# Patient Record
Sex: Female | Born: 1985 | Race: White | Hispanic: No | Marital: Married | Smoking: Current every day smoker
Health system: Southern US, Community
[De-identification: ages and names within clinical notes are randomized; demographics above are authoritative.]

## PROBLEM LIST (undated history)

## (undated) ENCOUNTER — Inpatient Hospital Stay (HOSPITAL_COMMUNITY): Payer: Self-pay

## (undated) DIAGNOSIS — F1990 Other psychoactive substance use, unspecified, uncomplicated: Secondary | ICD-10-CM

## (undated) DIAGNOSIS — F329 Major depressive disorder, single episode, unspecified: Secondary | ICD-10-CM

## (undated) DIAGNOSIS — Z5900 Homelessness unspecified: Secondary | ICD-10-CM

## (undated) DIAGNOSIS — F32A Depression, unspecified: Secondary | ICD-10-CM

## (undated) DIAGNOSIS — F419 Anxiety disorder, unspecified: Secondary | ICD-10-CM

## (undated) DIAGNOSIS — R51 Headache: Secondary | ICD-10-CM

## (undated) DIAGNOSIS — F1110 Opioid abuse, uncomplicated: Secondary | ICD-10-CM

## (undated) DIAGNOSIS — Z789 Other specified health status: Secondary | ICD-10-CM

## (undated) HISTORY — PX: NO PAST SURGERIES: SHX2092

## (undated) HISTORY — DX: Depression, unspecified: F32.A

## (undated) HISTORY — DX: Major depressive disorder, single episode, unspecified: F32.9

---
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
## 2007-07-08 ENCOUNTER — Inpatient Hospital Stay (HOSPITAL_COMMUNITY): Admission: AD | Admit: 2007-07-08 | Discharge: 2007-07-08 | Payer: Self-pay | Admitting: Obstetrics & Gynecology

## 2007-07-08 ENCOUNTER — Ambulatory Visit: Payer: Self-pay | Admitting: Physician Assistant

## 2007-10-28 ENCOUNTER — Ambulatory Visit (HOSPITAL_COMMUNITY): Admission: RE | Admit: 2007-10-28 | Discharge: 2007-10-28 | Payer: Self-pay | Admitting: Gynecology

## 2007-10-28 ENCOUNTER — Ambulatory Visit: Payer: Self-pay | Admitting: Obstetrics & Gynecology

## 2007-11-02 ENCOUNTER — Ambulatory Visit: Payer: Self-pay | Admitting: Obstetrics & Gynecology

## 2007-11-09 ENCOUNTER — Ambulatory Visit (HOSPITAL_COMMUNITY): Admission: RE | Admit: 2007-11-09 | Discharge: 2007-11-09 | Payer: Self-pay | Admitting: Obstetrics & Gynecology

## 2007-11-09 ENCOUNTER — Ambulatory Visit: Payer: Self-pay | Admitting: Obstetrics & Gynecology

## 2007-11-16 ENCOUNTER — Ambulatory Visit: Payer: Self-pay | Admitting: Obstetrics & Gynecology

## 2007-11-19 ENCOUNTER — Observation Stay: Payer: Self-pay

## 2007-11-26 ENCOUNTER — Ambulatory Visit: Payer: Self-pay | Admitting: Family Medicine

## 2007-11-27 ENCOUNTER — Inpatient Hospital Stay (HOSPITAL_COMMUNITY): Admission: AD | Admit: 2007-11-27 | Discharge: 2007-11-30 | Payer: Self-pay | Admitting: Obstetrics & Gynecology

## 2007-11-28 ENCOUNTER — Ambulatory Visit: Payer: Self-pay | Admitting: Obstetrics and Gynecology

## 2009-05-27 ENCOUNTER — Emergency Department (HOSPITAL_COMMUNITY): Admission: EM | Admit: 2009-05-27 | Discharge: 2009-05-27 | Payer: Self-pay | Admitting: Emergency Medicine

## 2009-06-25 ENCOUNTER — Emergency Department (HOSPITAL_COMMUNITY): Admission: EM | Admit: 2009-06-25 | Discharge: 2009-06-26 | Payer: Self-pay | Admitting: Emergency Medicine

## 2009-07-11 ENCOUNTER — Emergency Department (HOSPITAL_COMMUNITY): Admission: EM | Admit: 2009-07-11 | Discharge: 2009-07-11 | Payer: Self-pay | Admitting: Emergency Medicine

## 2009-09-27 ENCOUNTER — Emergency Department (HOSPITAL_COMMUNITY): Admission: EM | Admit: 2009-09-27 | Discharge: 2009-09-27 | Payer: Self-pay | Admitting: Emergency Medicine

## 2009-11-13 ENCOUNTER — Emergency Department: Payer: Self-pay | Admitting: Emergency Medicine

## 2010-12-29 LAB — OB RESULTS CONSOLE GC/CHLAMYDIA: Gonorrhea: NEGATIVE

## 2010-12-29 LAB — OB RESULTS CONSOLE RPR: RPR: NONREACTIVE

## 2010-12-29 LAB — OB RESULTS CONSOLE ABO/RH: RH Type: POSITIVE

## 2011-02-06 LAB — URINALYSIS, ROUTINE W REFLEX MICROSCOPIC
Bilirubin Urine: NEGATIVE
Glucose, UA: NEGATIVE mg/dL
Hgb urine dipstick: NEGATIVE
Ketones, ur: NEGATIVE mg/dL
Nitrite: NEGATIVE
Protein, ur: NEGATIVE mg/dL
Specific Gravity, Urine: 1.025 (ref 1.005–1.030)
Urobilinogen, UA: 0.2 mg/dL (ref 0.0–1.0)
pH: 6 (ref 5.0–8.0)

## 2011-02-08 LAB — URINE CULTURE

## 2011-02-08 LAB — URINE MICROSCOPIC-ADD ON

## 2011-02-08 LAB — CBC
HCT: 42.9 % (ref 36.0–46.0)
MCHC: 34.9 g/dL (ref 30.0–36.0)
MCV: 87.9 fL (ref 78.0–100.0)
Platelets: 212 10*3/uL (ref 150–400)
WBC: 9.9 10*3/uL (ref 4.0–10.5)

## 2011-02-08 LAB — URINALYSIS, ROUTINE W REFLEX MICROSCOPIC
Glucose, UA: NEGATIVE mg/dL
Ketones, ur: 15 mg/dL — AB
pH: 5.5 (ref 5.0–8.0)

## 2011-02-08 LAB — HEPATIC FUNCTION PANEL
AST: 15 U/L (ref 0–37)
Albumin: 4.2 g/dL (ref 3.5–5.2)
Total Protein: 7.4 g/dL (ref 6.0–8.3)

## 2011-02-08 LAB — BASIC METABOLIC PANEL
BUN: 15 mg/dL (ref 6–23)
CO2: 30 mEq/L (ref 19–32)
Chloride: 104 mEq/L (ref 96–112)
Potassium: 3.8 mEq/L (ref 3.5–5.1)

## 2011-02-08 LAB — DIFFERENTIAL
Eosinophils Absolute: 0 10*3/uL (ref 0.0–0.7)
Eosinophils Relative: 0 % (ref 0–5)
Lymphs Abs: 1 10*3/uL (ref 0.7–4.0)
Monocytes Relative: 5 % (ref 3–12)

## 2011-02-09 LAB — DIFFERENTIAL
Eosinophils Absolute: 0 10*3/uL (ref 0.0–0.7)
Lymphocytes Relative: 10 % — ABNORMAL LOW (ref 12–46)
Lymphs Abs: 0.7 10*3/uL (ref 0.7–4.0)
Monocytes Relative: 6 % (ref 3–12)
Neutrophils Relative %: 84 % — ABNORMAL HIGH (ref 43–77)

## 2011-02-09 LAB — URINALYSIS, ROUTINE W REFLEX MICROSCOPIC
Bilirubin Urine: NEGATIVE
Hgb urine dipstick: NEGATIVE
Specific Gravity, Urine: 1.02 (ref 1.005–1.030)
Urobilinogen, UA: 1 mg/dL (ref 0.0–1.0)

## 2011-02-09 LAB — URINE MICROSCOPIC-ADD ON

## 2011-02-09 LAB — CBC
MCV: 88 fL (ref 78.0–100.0)
RBC: 4.45 MIL/uL (ref 3.87–5.11)
WBC: 7.7 10*3/uL (ref 4.0–10.5)

## 2011-02-10 LAB — URINALYSIS, ROUTINE W REFLEX MICROSCOPIC
Glucose, UA: NEGATIVE mg/dL
Leukocytes, UA: NEGATIVE
Specific Gravity, Urine: >1.03 — ABNORMAL HIGH (ref 1.005–1.030)
pH: 6 (ref 5.0–8.0)

## 2011-02-10 LAB — URINE MICROSCOPIC-ADD ON

## 2011-07-25 LAB — CBC
HCT: 35.4 — ABNORMAL LOW
MCHC: 34.2
MCHC: 34.2
MCV: 81.8
Platelets: 176
Platelets: 208
RBC: 3.47 — ABNORMAL LOW
RDW: 15.5
WBC: 12.4 — ABNORMAL HIGH

## 2011-07-25 LAB — POCT URINALYSIS DIP (DEVICE)
Hgb urine dipstick: NEGATIVE
Nitrite: NEGATIVE
Nitrite: NEGATIVE
Protein, ur: NEGATIVE
Protein, ur: NEGATIVE
pH: 7
pH: 7

## 2011-07-25 LAB — CCBB MATERNAL DONOR DRAW

## 2011-07-28 ENCOUNTER — Emergency Department: Payer: Self-pay | Admitting: Internal Medicine

## 2011-08-09 LAB — POCT URINALYSIS DIP (DEVICE)
Ketones, ur: NEGATIVE
Ketones, ur: NEGATIVE
Nitrite: NEGATIVE
Nitrite: NEGATIVE
Operator id: 135281
Operator id: 159681
Protein, ur: NEGATIVE
Protein, ur: NEGATIVE
Urobilinogen, UA: 1
pH: 7
pH: 7

## 2011-08-16 LAB — URINALYSIS, ROUTINE W REFLEX MICROSCOPIC
Bilirubin Urine: NEGATIVE
Ketones, ur: 15 — AB
Nitrite: NEGATIVE
Protein, ur: NEGATIVE
Urobilinogen, UA: 0.2

## 2011-09-13 ENCOUNTER — Emergency Department: Payer: Self-pay | Admitting: Emergency Medicine

## 2011-09-16 ENCOUNTER — Other Ambulatory Visit: Payer: Self-pay | Admitting: Emergency Medicine

## 2011-10-02 ENCOUNTER — Emergency Department: Payer: Self-pay | Admitting: Emergency Medicine

## 2011-11-05 NOTE — L&D Delivery Note (Signed)
Delivery Note At 11:05 PM a viable and healthy female was delivered via Vaginal, Spontaneous Delivery (Presentation: Right Occiput Anterior).  APGAR: 7, 8; weight 7 lb 12.2 oz (3520 g).   Placenta status: intact, spontaneous.  Cord: 3 vessels with the following complications: None.    Anesthesia: Epidural  Episiotomy: None Lacerations: 1st degree;Perineal, hemostatic Suture Repair: N/A Est. Blood Loss (mL): 300 ml  Mom to postpartum.  Baby stable, placed skin to skin with mother.  LEFTWICH-KIRBY, Iridiana Fonner 05/19/2012, 11:31 PM

## 2012-02-12 ENCOUNTER — Emergency Department (HOSPITAL_COMMUNITY)
Admission: EM | Admit: 2012-02-12 | Discharge: 2012-02-12 | Disposition: A | Discharge status: Home / Self Care | Payer: Medicaid Other | Attending: Emergency Medicine | Admitting: Emergency Medicine

## 2012-02-12 ENCOUNTER — Encounter (HOSPITAL_COMMUNITY): Payer: Self-pay | Admitting: Emergency Medicine

## 2012-02-12 DIAGNOSIS — K047 Periapical abscess without sinus: Secondary | ICD-10-CM

## 2012-02-12 MED ORDER — HYDROCODONE-ACETAMINOPHEN 5-325 MG PO TABS: 1.0000 | ORAL_TABLET | Freq: Four times a day (QID) | ORAL | Status: AC | PRN

## 2012-02-12 MED ORDER — AMOXICILLIN 500 MG PO CAPS: 500.0000 mg | ORAL_CAPSULE | Freq: Three times a day (TID) | ORAL | Status: AC

## 2012-02-12 NOTE — Discharge Instructions (Signed)
Please increase water and juices. Tylenol every 4 hours as needed for pain or fever. PLEASE see a dentist as soon as possible for possible evacuation of your dental abscess. Amoxicillin three times daily with food. Norco ONLY if needed every 6 hours for pain. Please notify your Gyn MD to update him/her of medications you are taking.Abscessed Tooth An abscessed tooth is an infection around your tooth. It may be caused by holes or damage to the tooth (cavity) or a dental disease. An abscessed tooth causes mild to very bad pain in and around the tooth. See your dentist right away if you have tooth or gum pain. HOME CARE  Take your medicine as told. Finish it even if you start to feel better.   Do not drive after taking pain medicine.   Rinse your mouth (gargle) often with salt water ( teaspoon salt in 8 ounces of warm water).   Do not apply heat to the outside of your face.  GET HELP RIGHT AWAY IF:   You have a temperature by mouth above 102 F (38.9 C), not controlled by medicine.   You have chills and a very bad headache.   You have problems breathing or swallowing.   Your mouth will not open.   You develop puffiness (swelling) on the neck or around the eye.   Your pain is not helped by medicine.   Your pain is getting worse instead of better.  MAKE SURE YOU:   Understand these instructions.   Will watch your condition.   Will get help right away if you are not doing well or get worse.  Document Released: 04/08/2008 Document Revised: 10/10/2011 Document Reviewed: 01/29/2011 [facility] Patient Information 2012 [place], [place].

## 2012-02-12 NOTE — ED Notes (Signed)
Pt c/o rt lower jaw pain x one month, but states the area started to swell about Friday.

## 2012-02-12 NOTE — ED Provider Notes (Signed)
History     CSN: [id number]  Arrival date & time 02/12/12  4098   First MD Initiated Contact with Patient 02/12/12 [phone]      Chief Complaint  Patient presents with  . Dental Pain    (Consider location/radiation/quality/duration/timing/severity/associated sxs/prior treatment) Patient is a 26 y.o. female presenting with tooth pain. The history is provided by the patient.  Dental PainThe primary symptoms include mouth pain. Primary symptoms do not include shortness of breath or cough. The symptoms began more than 1 month ago. The symptoms are worsening. The symptoms are new. The symptoms occur constantly.  Additional symptoms include: dental sensitivity to temperature, gum swelling, gum tenderness, jaw pain and facial swelling. Additional symptoms do not include: nosebleeds. Associated medical issues comments: Pregnancy.    History reviewed. No pertinent past medical history.  History reviewed. No pertinent past surgical history.  History reviewed. No pertinent family history.  History  Substance Use Topics  . Smoking status: Not on file  . Smokeless tobacco: Not on file  . Alcohol Use: No    OB History    Grav Para Term Preterm Abortions TAB SAB Ect Mult Living   1               Review of Systems  Constitutional: Negative for activity change.       All ROS Neg except as noted in HPI  HENT: Positive for facial swelling and dental problem. Negative for nosebleeds and neck pain.   Eyes: Negative for photophobia and discharge.  Respiratory: Negative for cough, shortness of breath and wheezing.   Cardiovascular: Negative for chest pain and palpitations.  Gastrointestinal: Negative for abdominal pain and blood in stool.  Genitourinary: Negative for dysuria, frequency and hematuria.       Pregnancy  Musculoskeletal: Negative for back pain and arthralgias.  Skin: Negative.   Neurological: Negative for dizziness, seizures and speech difficulty.  Psychiatric/Behavioral:  Negative for hallucinations and confusion.    Allergies  Review of patient's allergies indicates no known allergies.  Home Medications   Current Outpatient Rx  Name Route Sig Dispense Refill  . AMOXICILLIN 500 MG PO CAPS Oral Take 1 capsule (500 mg total) by mouth 3 (three) times daily. Take with food 21 capsule 0  . HYDROCODONE-ACETAMINOPHEN 5-325 MG PO TABS Oral Take 1 tablet by mouth every 6 (six) hours as needed for pain. Take with food 15 tablet 0    BP 116/81  Pulse 102  Temp(Src) 97.9 F (36.6 C) (Oral)  Resp 20  Ht 5\' 4"  (1.626 m)  Wt 152 lb (68.947 kg)  BMI 26.09 kg/m2  SpO2 99%  LMP 08/08/2011  Physical Exam  Nursing note and vitals reviewed. Constitutional: She is oriented to person, place, and time. She appears well-developed and well-nourished.  Non-toxic appearance.  HENT:  Head: Normocephalic.  Right Ear: Tympanic membrane and external ear normal.  Left Ear: Tympanic membrane and external ear normal.       Abscess of the right lower jaw area. Airway patent. Speech clear.  Eyes: EOM and lids are normal. Pupils are equal, round, and reactive to light.  Neck: Normal range of motion. Neck supple. Carotid bruit is not present.  Cardiovascular: Normal rate, regular rhythm, normal heart sounds, intact distal pulses and normal pulses.  Exam reveals no gallop.   No murmur heard. Pulmonary/Chest: Breath sounds normal. No respiratory distress.  Abdominal: Soft. Bowel sounds are normal. There is no tenderness. There is no guarding.  Abd gravid  Musculoskeletal: Normal range of motion.  Lymphadenopathy:       Head (right side): No submandibular adenopathy present.       Head (left side): No submandibular adenopathy present.    She has no cervical adenopathy.  Neurological: She is alert and oriented to person, place, and time. She has normal strength. No cranial nerve deficit or sensory deficit.  Skin: Skin is warm and dry.  Psychiatric: She has a normal mood and  affect. Her speech is normal.    ED Course  Procedures (including critical care time)  Labs Reviewed - No data to display No results found.   1. Dental abscess       MDM  I have reviewed nursing notes, vital signs, and all appropriate lab and imaging results for this patient. Pt reports being [redacted] weeks pregnant. She has an abscess of the right lower jaw.  Rx for amoxicillin and norco -1 q6h, for severe pain. Tylenol every 4 hours for pain.. Pt to discuss medications with Gyn MD.       Kathie Dike, PA 02/12/12 [phone]

## 2012-02-13 NOTE — ED Provider Notes (Signed)
Medical screening examination/treatment/procedure(s) were performed by non-physician practitioner and as supervising physician I was immediately available for consultation/collaboration.  Shelda Jakes, MD 02/13/12 [phone]

## 2012-03-02 ENCOUNTER — Encounter (HOSPITAL_COMMUNITY): Payer: Self-pay

## 2012-03-02 ENCOUNTER — Inpatient Hospital Stay (HOSPITAL_COMMUNITY)
Admission: AD | Admit: 2012-03-02 | Discharge: 2012-03-02 | Disposition: A | Discharge status: Home / Self Care | Payer: Medicaid Other | Source: Ambulatory Visit | Attending: Obstetrics & Gynecology | Admitting: Obstetrics & Gynecology

## 2012-03-02 DIAGNOSIS — R0789 Other chest pain: Secondary | ICD-10-CM

## 2012-03-02 DIAGNOSIS — R071 Chest pain on breathing: Secondary | ICD-10-CM | POA: Insufficient documentation

## 2012-03-02 DIAGNOSIS — O99891 Other specified diseases and conditions complicating pregnancy: Secondary | ICD-10-CM | POA: Insufficient documentation

## 2012-03-02 DIAGNOSIS — R0781 Pleurodynia: Secondary | ICD-10-CM

## 2012-03-02 DIAGNOSIS — Z3490 Encounter for supervision of normal pregnancy, unspecified, unspecified trimester: Secondary | ICD-10-CM

## 2012-03-02 DIAGNOSIS — R079 Chest pain, unspecified: Secondary | ICD-10-CM

## 2012-03-02 HISTORY — DX: Other specified health status: Z78.9

## 2012-03-02 LAB — COMPREHENSIVE METABOLIC PANEL
ALT: 5 U/L (ref 0–35)
AST: 11 U/L (ref 0–37)
Albumin: 2.8 g/dL — ABNORMAL LOW (ref 3.5–5.2)
BUN: 4 mg/dL — ABNORMAL LOW (ref 6–23)
CO2: 23 mEq/L (ref 19–32)
Chloride: 103 mEq/L (ref 96–112)
Creatinine, Ser: 0.46 mg/dL — ABNORMAL LOW (ref 0.50–1.10)
GFR calc Af Amer: >90 mL/min (ref >90)
Total Protein: 6.4 g/dL (ref 6.0–8.3)

## 2012-03-02 LAB — CBC
HCT: 38.2 % (ref 36.0–46.0)
Hemoglobin: 12.7 g/dL (ref 12.0–15.0)
MCV: 91.4 fL (ref 78.0–100.0)
RBC: 4.18 MIL/uL (ref 3.87–5.11)
WBC: 14.1 10*3/uL — ABNORMAL HIGH (ref 4.0–10.5)

## 2012-03-02 LAB — URINALYSIS, ROUTINE W REFLEX MICROSCOPIC
Glucose, UA: NEGATIVE mg/dL
Hgb urine dipstick: NEGATIVE
Specific Gravity, Urine: 1.015 (ref 1.005–1.030)
pH: 7 (ref 5.0–8.0)

## 2012-03-02 LAB — URINE MICROSCOPIC-ADD ON

## 2012-03-02 MED ORDER — CYCLOBENZAPRINE HCL 10 MG PO TABS: 10.0000 mg | ORAL_TABLET | Freq: Three times a day (TID) | ORAL | Status: AC | PRN

## 2012-03-02 MED ORDER — GI COCKTAIL ~~LOC~~
30.0000 mL | Freq: Once | ORAL | Status: AC
  Administered 2012-03-02: 30 mL via ORAL
  Filled 2012-03-02: qty 30

## 2012-03-02 MED ORDER — OXYCODONE-ACETAMINOPHEN 5-500 MG PO TABS: 1.0000 | ORAL_TABLET | ORAL | Status: AC | PRN

## 2012-03-02 NOTE — MAU Note (Signed)
Pt in c/o pain in ruq underneath breast in ribcage x 1.5 weeks.  Worsening pain since Friday.  States pain is a squeezing sensation and worse with movement.  Reports sharp stabbing pains when taking a deep breath.  Denies any bleeding or lof.  + FM.

## 2012-03-02 NOTE — MAU Note (Signed)
Patient states she started having "rib"pain on the right side on 4-20. Has become more consistent and more painful. Unable to sleep without sitting up at night. Better during the day. Hurts worse when taking a deep breath. Denies any bleeding, contractions or leaking. Reports good fetal movement. Has nausea but no vomiting.

## 2012-03-02 NOTE — MAU Provider Note (Signed)
History     CSN: [id number]  Arrival date and time: 03/02/12 1742   First Provider Initiated Contact with Patient 03/02/12 1837      Chief Complaint  Patient presents with  . Chest Pain   HPI 26 y.o. G3P1011 at [redacted]w[redacted]d with right "rib" pain since 4/20, worsening, was only at night with laying down, now constant. Worse with laying down and reaching, states it feels "like a bruise". Nausea throughout pregnancy, no worse now, no vomiting, no headache or vision changes. + fetal movement. Prenatal care at [facility], has appointment next week.   Past Medical History  Diagnosis Date  . No pertinent past medical history     Past Surgical History  Procedure Date  . No past surgeries     Family History  Problem Relation Age of Onset  . Anesthesia problems Neg Hx     History  Substance Use Topics  . Smoking status: Never Smoker   . Smokeless tobacco: Not on file  . Alcohol Use: No    Allergies: No Known Allergies  Prescriptions prior to admission  Medication Sig Dispense Refill  . HYDROcodone-acetaminophen (NORCO) 5-325 MG per tablet Take 1 tablet by mouth every 6 (six) hours as needed. For pain.      . Prenatal Vit-Fe Fumarate-FA (PRENATAL MULTIVITAMIN) TABS Take 1 tablet by mouth at bedtime.        Review of Systems  Constitutional: Negative.   Respiratory: Negative.   Cardiovascular: Negative.   Gastrointestinal: Positive for nausea. Negative for vomiting, abdominal pain, diarrhea and constipation.  Genitourinary: Negative for dysuria, urgency, frequency, hematuria and flank pain.       Negative for vaginal bleeding, cramping/contractions  Musculoskeletal: Negative.   Neurological: Negative.   Psychiatric/Behavioral: Negative.    Physical Exam   Blood pressure 134/79, pulse 98, temperature 98 F (36.7 C), temperature source Oral, resp. rate 16, height 5' 4.5" (1.638 m), weight 154 lb 3.2 oz (69.945 kg), last menstrual period 08/08/2011, SpO2 99.00%.  Physical  Exam  Nursing note and vitals reviewed. Constitutional: She is oriented to person, place, and time. She appears well-developed and well-nourished. No distress.  Cardiovascular: Normal rate.   Respiratory: Effort normal. No respiratory distress. She exhibits no tenderness.  GI: Soft. There is no tenderness.  Musculoskeletal: Normal range of motion.  Neurological: She is alert and oriented to person, place, and time.  Skin: Skin is warm and dry.  Psychiatric: She has a normal mood and affect.   EFM reactive  MAU Course  Procedures  Results for orders placed during the hospital encounter of 03/02/12 (from the past 24 hour(s))  URINALYSIS, ROUTINE W REFLEX MICROSCOPIC     Status: Abnormal   Collection Time   03/02/12  6:05 PM      Component Value Range   Color, Urine YELLOW  YELLOW    APPearance CLOUDY (*) CLEAR    Specific Gravity, Urine 1.015  1.005 - 1.030    pH 7.0  5.0 - 8.0    Glucose, UA NEGATIVE  NEGATIVE (mg/dL)   Hgb urine dipstick NEGATIVE  NEGATIVE    Bilirubin Urine NEGATIVE  NEGATIVE    Ketones, ur NEGATIVE  NEGATIVE (mg/dL)   Protein, ur NEGATIVE  NEGATIVE (mg/dL)   Urobilinogen, UA 0.2  0.0 - 1.0 (mg/dL)   Nitrite NEGATIVE  NEGATIVE    Leukocytes, UA SMALL (*) NEGATIVE   URINE MICROSCOPIC-ADD ON     Status: Abnormal   Collection Time   03/02/12  6:05  PM      Component Value Range   Squamous Epithelial / LPF FEW (*) RARE    WBC, UA 0-2  <3 (WBC/hpf)   Bacteria, UA FEW (*) RARE   CBC     Status: Abnormal   Collection Time   03/02/12  6:15 PM      Component Value Range   WBC 14.1 (*) 4.0 - 10.5 (K/uL)   RBC 4.18  3.87 - 5.11 (MIL/uL)   Hemoglobin 12.7  12.0 - 15.0 (g/dL)   HCT 09.8  11.9 - 14.7 (%)   MCV 91.4  78.0 - 100.0 (fL)   MCH 30.4  26.0 - 34.0 (pg)   MCHC 33.2  30.0 - 36.0 (g/dL)   RDW 82.9  56.2 - 13.0 (%)   Platelets 167  150 - 400 (K/uL)  COMPREHENSIVE METABOLIC PANEL     Status: Abnormal   Collection Time   03/02/12  6:15 PM      Component  Value Range   Sodium 136  135 - 145 (mEq/L)   Potassium 4.0  3.5 - 5.1 (mEq/L)   Chloride 103  96 - 112 (mEq/L)   CO2 23  19 - 32 (mEq/L)   Glucose, Bld 99  70 - 99 (mg/dL)   BUN 4 (*) 6 - 23 (mg/dL)   Creatinine, Ser 8.65 (*) 0.50 - 1.10 (mg/dL)   Calcium 9.1  8.4 - 78.4 (mg/dL)   Total Protein 6.4  6.0 - 8.3 (g/dL)   Albumin 2.8 (*) 3.5 - 5.2 (g/dL)   AST 11  0 - 37 (U/L)   ALT 5  0 - 35 (U/L)   Alkaline Phosphatase 77  39 - 117 (U/L)   Total Bilirubin 0.3  0.3 - 1.2 (mg/dL)   GFR calc non Af Amer >90  >90 (mL/min)   GFR calc Af Amer >90  >90 (mL/min)   No change in pain with GI cocktail  Assessment and Plan  25 y.o. G3P1011 at [redacted]w[redacted]d Right chest wall pain - likely musculoskeletal in origin Rx Percocet (#30) and Flexeril Follow up as scheduled next week or sooner with any worsening symptoms  Haani Bakula 03/02/2012, 6:38 PM

## 2012-03-03 NOTE — MAU Provider Note (Signed)
Agree with above note.  Jeziah Kretschmer H. 03/03/2012 12:27 AM

## 2012-04-30 LAB — OB RESULTS CONSOLE GBS: GBS: NEGATIVE

## 2012-05-19 ENCOUNTER — Encounter (HOSPITAL_COMMUNITY): Payer: Self-pay | Admitting: *Deleted

## 2012-05-19 ENCOUNTER — Telehealth (HOSPITAL_COMMUNITY): Payer: Self-pay | Admitting: *Deleted

## 2012-05-19 ENCOUNTER — Inpatient Hospital Stay (HOSPITAL_COMMUNITY): Payer: Medicaid Other | Admitting: Anesthesiology

## 2012-05-19 ENCOUNTER — Encounter (HOSPITAL_COMMUNITY): Payer: Self-pay | Admitting: Anesthesiology

## 2012-05-19 ENCOUNTER — Inpatient Hospital Stay (HOSPITAL_COMMUNITY)
Admission: AD | Admit: 2012-05-19 | Discharge: 2012-05-21 | DRG: 775 | Disposition: A | Discharge status: Home / Self Care | Payer: Medicaid Other | Source: Ambulatory Visit | Attending: Obstetrics & Gynecology | Admitting: Obstetrics & Gynecology

## 2012-05-19 DIAGNOSIS — O480 Post-term pregnancy: Secondary | ICD-10-CM

## 2012-05-19 DIAGNOSIS — IMO0001 Reserved for inherently not codable concepts without codable children: Secondary | ICD-10-CM

## 2012-05-19 HISTORY — DX: Headache: R51

## 2012-05-19 LAB — CBC
Platelets: 170 10*3/uL (ref 150–400)
RBC: 5.1 MIL/uL (ref 3.87–5.11)
RDW: 13.7 % (ref 11.5–15.5)
WBC: 33.4 10*3/uL — ABNORMAL HIGH (ref 4.0–10.5)

## 2012-05-19 MED ORDER — FENTANYL 2.5 MCG/ML BUPIVACAINE 1/10 % EPIDURAL INFUSION (WH - ANES): 14.0000 mL/h | INTRAMUSCULAR | Status: DC

## 2012-05-19 MED ORDER — PHENYLEPHRINE 40 MCG/ML (10ML) SYRINGE FOR IV PUSH (FOR BLOOD PRESSURE SUPPORT): 80.0000 ug | PREFILLED_SYRINGE | INTRAVENOUS | Status: DC | PRN

## 2012-05-19 MED ORDER — ONDANSETRON HCL 4 MG/2ML IJ SOLN: 4.0000 mg | Freq: Four times a day (QID) | INTRAMUSCULAR | Status: DC | PRN

## 2012-05-19 MED ORDER — LACTATED RINGERS IV SOLN: 500.0000 mL | Freq: Once | INTRAVENOUS | Status: DC

## 2012-05-19 MED ORDER — PHENYLEPHRINE 40 MCG/ML (10ML) SYRINGE FOR IV PUSH (FOR BLOOD PRESSURE SUPPORT)
80.0000 ug | PREFILLED_SYRINGE | INTRAVENOUS | Status: DC | PRN
  Filled 2012-05-19: qty 5

## 2012-05-19 MED ORDER — LACTATED RINGERS IV SOLN
INTRAVENOUS | Status: DC
  Administered 2012-05-19 (×2): via INTRAVENOUS

## 2012-05-19 MED ORDER — ACETAMINOPHEN 325 MG PO TABS: 650.0000 mg | ORAL_TABLET | ORAL | Status: DC | PRN

## 2012-05-19 MED ORDER — OXYTOCIN BOLUS FROM INFUSION
250.0000 mL | Freq: Once | INTRAVENOUS | Status: DC
  Filled 2012-05-19: qty 500

## 2012-05-19 MED ORDER — EPHEDRINE 5 MG/ML INJ
10.0000 mg | INTRAVENOUS | Status: DC | PRN
  Administered 2012-05-19: 10 mg via INTRAVENOUS
  Filled 2012-05-19: qty 4

## 2012-05-19 MED ORDER — LIDOCAINE HCL (PF) 1 % IJ SOLN
30.0000 mL | INTRAMUSCULAR | Status: DC | PRN
  Filled 2012-05-19: qty 30

## 2012-05-19 MED ORDER — FENTANYL 2.5 MCG/ML BUPIVACAINE 1/10 % EPIDURAL INFUSION (WH - ANES)
14.0000 mL/h | INTRAMUSCULAR | Status: DC
  Administered 2012-05-19: 14 mL/h via EPIDURAL
  Filled 2012-05-19: qty 60

## 2012-05-19 MED ORDER — CITRIC ACID-SODIUM CITRATE 334-500 MG/5ML PO SOLN: 30.0000 mL | ORAL | Status: DC | PRN

## 2012-05-19 MED ORDER — EPHEDRINE 5 MG/ML INJ: 10.0000 mg | INTRAVENOUS | Status: DC | PRN

## 2012-05-19 MED ORDER — FLEET ENEMA 7-19 GM/118ML RE ENEM: 1.0000 | ENEMA | RECTAL | Status: DC | PRN

## 2012-05-19 MED ORDER — LIDOCAINE HCL (PF) 1 % IJ SOLN
INTRAMUSCULAR | Status: DC | PRN
  Administered 2012-05-19 (×3): 4 mL

## 2012-05-19 MED ORDER — DIPHENHYDRAMINE HCL 50 MG/ML IJ SOLN: 12.5000 mg | INTRAMUSCULAR | Status: DC | PRN

## 2012-05-19 MED ORDER — OXYTOCIN 40 UNITS IN LACTATED RINGERS INFUSION - SIMPLE MED
62.5000 mL/h | Freq: Once | INTRAVENOUS | Status: AC
  Administered 2012-05-19: 62.5 mL/h via INTRAVENOUS
  Filled 2012-05-19: qty 1000

## 2012-05-19 MED ORDER — LACTATED RINGERS IV SOLN
500.0000 mL | Freq: Once | INTRAVENOUS | Status: AC
  Administered 2012-05-19: 500 mL via INTRAVENOUS

## 2012-05-19 MED ORDER — LACTATED RINGERS IV SOLN
500.0000 mL | INTRAVENOUS | Status: DC | PRN
  Administered 2012-05-19: 500 mL via INTRAVENOUS

## 2012-05-19 MED ORDER — OXYCODONE-ACETAMINOPHEN 5-325 MG PO TABS: 1.0000 | ORAL_TABLET | ORAL | Status: DC | PRN

## 2012-05-19 MED ORDER — PHENYLEPHRINE 40 MCG/ML (10ML) SYRINGE FOR IV PUSH (FOR BLOOD PRESSURE SUPPORT)
80.0000 ug | PREFILLED_SYRINGE | INTRAVENOUS | Status: DC | PRN
  Administered 2012-05-19 (×2): 80 ug via INTRAVENOUS
  Filled 2012-05-19: qty 5

## 2012-05-19 NOTE — Telephone Encounter (Signed)
Preadmission screen  

## 2012-05-19 NOTE — Anesthesia Preprocedure Evaluation (Signed)
Anesthesia Evaluation  Patient identified by MRN, date of birth, ID band Patient awake    Reviewed: Allergy & Precautions, H&P , NPO status , Patient's Chart, lab work & pertinent test results, reviewed documented beta blocker date and time   History of Anesthesia Complications Negative for: history of anesthetic complications  Airway Mallampati: I TM Distance: >3 FB Neck ROM: full    Dental  (+) Teeth Intact   Pulmonary neg pulmonary ROS,  breath sounds clear to auscultation        Cardiovascular negative cardio ROS  Rhythm:regular Rate:Normal     Neuro/Psych  Headaches, negative neurological ROS  negative psych ROS   GI/Hepatic negative GI ROS, Neg liver ROS,   Endo/Other  negative endocrine ROS  Renal/GU negative Renal ROS  negative genitourinary   Musculoskeletal   Abdominal   Peds  Hematology negative hematology ROS (+)   Anesthesia Other Findings   Reproductive/Obstetrics (+) Pregnancy                           Anesthesia Physical Anesthesia Plan  ASA: II  Anesthesia Plan: Epidural   Post-op Pain Management:    Induction:   Airway Management Planned:   Additional Equipment:   Intra-op Plan:   Post-operative Plan:   Informed Consent: I have reviewed the patients History and Physical, chart, labs and discussed the procedure including the risks, benefits and alternatives for the proposed anesthesia with the patient or authorized representative who has indicated his/her understanding and acceptance.   Dental Advisory Given  Plan Discussed with: Surgeon and CRNA  Anesthesia Plan Comments:         Anesthesia Quick Evaluation

## 2012-05-19 NOTE — H&P (Signed)
Victoria Soto is a 26 y.o. female G3P1011 at 40 weeks + 5 days gestation by LMP and 2nd trimester U/S who presented to the MAU today for strong contractions every 2-3 minutes that started about an hour after she got her membranes stripped at the [facility] office today.  At the time, she reports that she was 3 cm (at about 10 am).  She denies LOF and vaginal bleeding.  She reports good fetal movement.  Her pregnancy has been uncomplicated.    Maternal Medical History:  Reason for admission: Reason for admission: contractions.  Contractions: Onset was 1-2 hours ago.   Frequency: regular.   Duration is approximately 60 seconds.   Perceived severity is strong.    Fetal activity: Perceived fetal activity is normal.   Last perceived fetal movement was within the past hour.    Prenatal complications: No bleeding, hypertension, IUGR, placental abnormality, pre-eclampsia, preterm labor or substance abuse.   Prenatal Complications - Diabetes: none.    OB History    Grav Para Term Preterm Abortions TAB SAB Ect Mult Living   3 1 1  0 1 0 1 0 0 1     Past Medical History  Diagnosis Date  . No pertinent past medical history    Past Surgical History  Procedure Date  . No past surgeries    Family History: family history includes Diabetes in her father; Hypertension in her mother; and Muscular dystrophy in her other.  There is no history of Anesthesia problems. Social History:  reports that she has never smoked. She does not have any smokeless tobacco history on file. She reports that she does not drink alcohol or use illicit drugs.   Prenatal Transfer Tool  Review of Systems  Constitutional: Negative for fever and chills.  Eyes: Negative for blurred vision.  Respiratory: Negative for cough and shortness of breath.   Cardiovascular: Negative for chest pain.  Gastrointestinal: Negative for vomiting and diarrhea.  Genitourinary: Negative for dysuria and urgency.  Musculoskeletal:  Positive for back pain (with contractions).  Skin: Negative for itching and rash.  Neurological: Negative for dizziness and headaches.  Psychiatric/Behavioral: Negative for depression.   First exam at 1545: Dilation: 4 Effacement: 80% Station: -1 to 0  Second exam at 1715: Dilation: 5 Effacement:80% Station: -1 to 0  Blood pressure 115/77, pulse 120, temperature 98 F (36.7 C), temperature source Oral, resp. rate 18, height 5\' 5"  (1.651 m), weight 71.578 kg (157 lb 12.8 oz), last menstrual period 08/08/2011, SpO2 100.00%.  Maternal Exam:  Uterine Assessment: Contraction strength is moderate.  Tocometry not picking up well, ctx seem to last about 45 seconds  Abdomen: Patient reports no abdominal tenderness. Fetal presentation: vertex  Introitus: Normal vulva. Normal vagina.  Ferning test: not done.  Nitrazine test: not done.  Cervix: Cervix evaluated by digital exam.     Fetal Exam Fetal Monitor Review: Mode: ultrasound.   Baseline rate: 125.  Variability: moderate (6-25 bpm).   Pattern: accelerations present.   Often picking up maternal HR  Fetal State Assessment: Category II - tracings are indeterminate.     Physical Exam  Constitutional: She is oriented to person, place, and time. She appears well-developed and well-nourished. She appears distressed (during contractions).  HENT:  Head: Normocephalic and atraumatic.  Eyes: Conjunctivae are normal.  Neck: Normal range of motion. Neck supple.  Cardiovascular: Normal rate, regular rhythm and normal heart sounds.   Respiratory: Effort normal and breath sounds normal.  GI: Soft. Bowel sounds  are normal.  Neurological: She is alert and oriented to person, place, and time.  Skin: She is not diaphoretic.  Psychiatric: She has a normal mood and affect. Her behavior is normal.    Prenatal labs: ABO, Rh:  O pos Antibody:  Neg Rubella:  Ab present RPR:   NR HBsAg:   NR HIV:   NR GBS: Negative 6/27    Assessment/Plan: G3P1011, GBS negative at 40.5 here in active labor:  Admit to L&D.  Expectant management.  If cervical dilation stalls, will start Pitocin - pt in agreement and wants epidural.  [name] 05/19/2012, 5:27 PM

## 2012-05-19 NOTE — MAU Note (Signed)
Patient states she was seen at [facility] this am and was 3-4cm and had membranes striped. States contractions every 3-4 minutes, no bleeding or leaking and reports good fetal movement.

## 2012-05-19 NOTE — Anesthesia Procedure Notes (Signed)
Epidural Patient location during procedure: OB Start time: 05/19/2012 6:49 PM Reason for block: procedure for pain  Staffing Performed by: anesthesiologist   Preanesthetic Checklist Completed: patient identified, site marked, surgical consent, pre-op evaluation, timeout performed, IV checked, risks and benefits discussed and monitors and equipment checked  Epidural Patient position: sitting Prep: site prepped and draped and DuraPrep Patient monitoring: continuous pulse ox and blood pressure Approach: midline Injection technique: LOR air  Needle:  Needle type: Tuohy  Needle gauge: 17 G Needle length: 9 cm Needle insertion depth: 5 cm cm Catheter type: closed end flexible Catheter size: 19 Gauge Catheter at skin depth: 10 cm Test dose: negative  Assessment Events: blood not aspirated, injection not painful, no injection resistance, negative IV test and no paresthesia  Additional Notes Discussed risk of headache, infection, bleeding, nerve injury and failed or incomplete block.  Patient voices understanding and wishes to proceed.

## 2012-05-19 NOTE — Progress Notes (Signed)
Victoria Soto is a 26 y.o. G3P1011 at [redacted]w[redacted]d by LMP admitted for active labor  Subjective: We were called to the room for fetal bradycardia.  She had just received her epidural and had gotten one dose of Neosynephrine for maternal hypotension.  She had been pre-bolused with of LR beforehand.  Mom was feeling fine, just c/o heart racing.  Baby's HR was in the 70s with the external monitor and mom's was in the 120s.  We placed mom in hands-and-knees positioning and still had FHR no higher than 107, and that was with stimulation.  Therefore, we placed an FSE (which led to AROM w/ clear fluid) and gave her a dose of Ephedrine, which resulted in almost immediate dramatic improvement in the fetal heart rate.  Objective: BP 106/70  Pulse 142  Temp 98.3 F (36.8 C) (Oral)  Resp 18  Ht 5\' 5"  (1.651 m)  Wt 71.578 kg (157 lb 12.8 oz)  BMI 26.26 kg/m2  SpO2 98%  LMP 08/08/2011      FHT: After the above-mentioned events - FHR: 135 bpm, variability: moderate,  accelerations:  Present,  decelerations:  Absent UC:   regular, every 2-4 minutes SVE:   Dilation: 5 Effacement (%): 80 Station: -1 Exam by:: Victoria Soto, CNM  Labs: Lab Results  Component Value Date   WBC 33.4* 05/19/2012   HGB 15.2* 05/19/2012   HCT 45.2 05/19/2012   MCV 88.6 05/19/2012   PLT 170 05/19/2012    Assessment / Plan: Spontaneous labor, progressing normally  Labor: Progressing normally Fetal Wellbeing:  After intervention, Category I. Baseline variability was moderate throughout the episode. Pain Control:  Epidural Anticipated MOD:  NSVD  Victoria Soto 05/19/2012, 8:26 PM

## 2012-05-20 ENCOUNTER — Encounter (HOSPITAL_COMMUNITY): Payer: Self-pay | Admitting: *Deleted

## 2012-05-20 LAB — CBC
MCV: 89.2 fL (ref 78.0–100.0)
Platelets: 171 10*3/uL (ref 150–400)
RDW: 14 % (ref 11.5–15.5)
WBC: 31.1 10*3/uL — ABNORMAL HIGH (ref 4.0–10.5)

## 2012-05-20 LAB — RPR: RPR Ser Ql: NONREACTIVE

## 2012-05-20 MED ORDER — DIPHENHYDRAMINE HCL 25 MG PO CAPS: 25.0000 mg | ORAL_CAPSULE | Freq: Four times a day (QID) | ORAL | Status: DC | PRN

## 2012-05-20 MED ORDER — OXYCODONE-ACETAMINOPHEN 5-325 MG PO TABS
1.0000 | ORAL_TABLET | ORAL | Status: DC | PRN
  Administered 2012-05-20: 1 via ORAL
  Filled 2012-05-20: qty 1

## 2012-05-20 MED ORDER — ONDANSETRON HCL 4 MG PO TABS: 4.0000 mg | ORAL_TABLET | ORAL | Status: DC | PRN

## 2012-05-20 MED ORDER — ZOLPIDEM TARTRATE 5 MG PO TABS: 5.0000 mg | ORAL_TABLET | Freq: Every evening | ORAL | Status: DC | PRN

## 2012-05-20 MED ORDER — PRENATAL MULTIVITAMIN CH
1.0000 | ORAL_TABLET | Freq: Every day | ORAL | Status: DC
  Administered 2012-05-20 – 2012-05-21 (×2): 1 via ORAL
  Filled 2012-05-20 (×2): qty 1

## 2012-05-20 MED ORDER — SIMETHICONE 80 MG PO CHEW: 80.0000 mg | CHEWABLE_TABLET | ORAL | Status: DC | PRN

## 2012-05-20 MED ORDER — DIBUCAINE 1 % RE OINT: 1.0000 "application " | TOPICAL_OINTMENT | RECTAL | Status: DC | PRN

## 2012-05-20 MED ORDER — SENNOSIDES-DOCUSATE SODIUM 8.6-50 MG PO TABS
2.0000 | ORAL_TABLET | Freq: Every day | ORAL | Status: DC
  Administered 2012-05-20: 2 via ORAL

## 2012-05-20 MED ORDER — TETANUS-DIPHTH-ACELL PERTUSSIS 5-2.5-18.5 LF-MCG/0.5 IM SUSP
0.5000 mL | Freq: Once | INTRAMUSCULAR | Status: AC
  Administered 2012-05-21: 0.5 mL via INTRAMUSCULAR
  Filled 2012-05-20: qty 0.5

## 2012-05-20 MED ORDER — WITCH HAZEL-GLYCERIN EX PADS: 1.0000 "application " | MEDICATED_PAD | CUTANEOUS | Status: DC | PRN

## 2012-05-20 MED ORDER — LANOLIN HYDROUS EX OINT: TOPICAL_OINTMENT | CUTANEOUS | Status: DC | PRN

## 2012-05-20 MED ORDER — ONDANSETRON HCL 4 MG/2ML IJ SOLN: 4.0000 mg | INTRAMUSCULAR | Status: DC | PRN

## 2012-05-20 MED ORDER — IBUPROFEN 600 MG PO TABS
600.0000 mg | ORAL_TABLET | Freq: Four times a day (QID) | ORAL | Status: DC
  Administered 2012-05-20 – 2012-05-21 (×6): 600 mg via ORAL
  Filled 2012-05-20 (×6): qty 1

## 2012-05-20 MED ORDER — BENZOCAINE-MENTHOL 20-0.5 % EX AERO
1.0000 "application " | INHALATION_SPRAY | CUTANEOUS | Status: DC | PRN
  Administered 2012-05-20: 1 via TOPICAL
  Filled 2012-05-20: qty 56

## 2012-05-20 NOTE — Progress Notes (Signed)
Ur chart review completed.  

## 2012-05-20 NOTE — Anesthesia Postprocedure Evaluation (Signed)
  Anesthesia Post-op Note  Patient: Victoria Soto  Procedure(s) Performed: * No procedures listed *  Patient Location: PACU and Mother/Baby  Anesthesia Type: Epidural  Level of Consciousness: awake, alert  and oriented  Airway and Oxygen Therapy: Patient Spontanous Breathing  Post-op Pain: none  Post-op Assessment: Post-op Vital signs reviewed and Patient's Cardiovascular Status Stable  Post-op Vital Signs: Reviewed and stable  Complications: No apparent anesthesia complications

## 2012-05-20 NOTE — Progress Notes (Signed)
I have seen this patient and agree with the above medical student's note.  LEFTWICH-KIRBY, LISA Certified Nurse-Midwife

## 2012-05-20 NOTE — Progress Notes (Signed)
Subjective:  Pt doing well. Slept some overnight. Pain controlled: Yes. Lochia: decreased.  Eating/drinking: Yes. Flatus: No. BM: No. Voiding: Yes. Ambulating: Yes. Breast feeding well: Just initiated, still colostrum.   Objective: Filed Vitals:   05/20/12 0211  BP: 107/68  Pulse: 105  Temp: 98.3 F (36.8 C)  Resp: 18    Gen: NAD, doing well CV: RRR Pulm: CTAB Abd: soft, + bowel sounds, Fundus firm Ext: No edema, Homan's neg  Results for orders placed during the hospital encounter of 05/19/12 (from the past 24 hour(s))  CBC     Status: Abnormal   Collection Time   05/19/12  6:00 PM      Component Value Range   WBC 33.4 (*) 4.0 - 10.5 K/uL   RBC 5.10  3.87 - 5.11 MIL/uL   Hemoglobin 15.2 (*) 12.0 - 15.0 g/dL   HCT 40.9  81.1 - 91.4 %   MCV 88.6  78.0 - 100.0 fL   MCH 29.8  26.0 - 34.0 pg   MCHC 33.6  30.0 - 36.0 g/dL   RDW 78.2  95.6 - 21.3 %   Platelets 170  150 - 400 K/uL  RPR     Status: Normal   Collection Time   05/19/12  6:00 PM      Component Value Range   RPR NON REACTIVE  NON REACTIVE  CBC     Status: Abnormal   Collection Time   05/20/12  5:53 AM      Component Value Range   WBC 31.1 (*) 4.0 - 10.5 K/uL   RBC 4.08  3.87 - 5.11 MIL/uL   Hemoglobin 12.1  12.0 - 15.0 g/dL   HCT 08.6  57.8 - 46.9 %   MCV 89.2  78.0 - 100.0 fL   MCH 29.7  26.0 - 34.0 pg   MCHC 33.2  30.0 - 36.0 g/dL   RDW 62.9  52.8 - 41.3 %   Platelets 171  150 - 400 K/uL     A/P: 26 y.o. year old G22P1021 PPD# 1 s/p SVD w/o complications -female/ circumcision No/ breast/ birth control: injectable contraception (depo) -Continue routine post-partum care. -Anticipate d/c PPD #2 -F/u in 6 weeks at [facility]

## 2012-05-21 MED ORDER — IBUPROFEN 600 MG PO TABS: 600.0000 mg | ORAL_TABLET | Freq: Four times a day (QID) | ORAL | Status: AC

## 2012-05-21 NOTE — Progress Notes (Signed)
SW spoke to D. Graves/[place] CPS intake worker who states that they screened out the report and will not be involved with MOB and this baby.  SW made [facility] referral and left message for James Burston, PHD/counselor in [place] to make referral for outpatient counseling.  SW received this doctor's name from CPS worker.  SW inquired about anger management classes and could not locate these services in [place] but anger issues can be addressed by therapist.  No barriers to discharge. 
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
## 2012-05-21 NOTE — Progress Notes (Signed)
SW met with MOB to inform her that CPS will not be involved at this time.  She was relieved and knows that she has to make decisions based on the best interest of her son and not herself.  She still wants to start counseling and SW informed her that SW left message for Dr. James Burston/counselo in [place].  SW gave MOB his phone number also.  She was very appreciative. 

## 2012-05-21 NOTE — Discharge Summary (Signed)
  Obstetric Discharge Summary Reason for Admission: rupture of membranes Prenatal Procedures: none Intrapartum Procedures: spontaneous vaginal delivery Postpartum Procedures: none Complications-Operative and Postpartum: none Hemoglobin  Date Value Range Status  05/20/2012 12.1  12.0 - 15.0 g/dL Final     DELTA CHECK NOTED     REPEATED TO VERIFY     HCT  Date Value Range Status  05/20/2012 36.4  36.0 - 46.0 % Final    Physical Exam:  General: alert, cooperative and no distress Lochia: appropriate Uterine Fundus: firm Incision: n/a DVT Evaluation: No evidence of DVT seen on physical exam. No significant calf/ankle edema.  Discharge Diagnoses: Post-date pregnancy  Discharge Information: Date: 05/21/2012 Activity: pelvic rest Diet: routine Medications: PNV and Ibuprofen Condition: stable Instructions: refer to practice specific booklet Discharge to: home Follow-up Information    Follow up with [facility]. Schedule an appointment as soon as possible for a visit in 6 weeks.   Contact information:   [address] [place] [address]          Newborn Data: Live born female  Birth Weight: 7 lb 12.2 oz (3520 g) APGAR: 7, 8  Home with mother.  Lewie Chamber 05/21/2012, 7:40 AM  I have seen and examined this patient and I agree with the above. Breastfeeding; plans Depo. Cam Hai 8:45 AM 05/21/2012

## 2012-05-23 ENCOUNTER — Inpatient Hospital Stay (HOSPITAL_COMMUNITY): Admission: RE | Admit: 2012-05-23 | Payer: Medicaid Other | Source: Ambulatory Visit

## 2012-09-14 ENCOUNTER — Encounter (HOSPITAL_COMMUNITY): Payer: Self-pay | Admitting: *Deleted

## 2012-09-14 ENCOUNTER — Emergency Department (HOSPITAL_COMMUNITY)
Admission: EM | Admit: 2012-09-14 | Discharge: 2012-09-14 | Disposition: A | Discharge status: Home / Self Care | Payer: Medicaid Other | Attending: Emergency Medicine | Admitting: Emergency Medicine

## 2012-09-14 DIAGNOSIS — L02412 Cutaneous abscess of left axilla: Secondary | ICD-10-CM

## 2012-09-14 DIAGNOSIS — IMO0002 Reserved for concepts with insufficient information to code with codable children: Secondary | ICD-10-CM | POA: Insufficient documentation

## 2012-09-14 MED ORDER — OXYCODONE-ACETAMINOPHEN 5-325 MG PO TABS: 1.0000 | ORAL_TABLET | Freq: Four times a day (QID) | ORAL | Status: DC | PRN

## 2012-09-14 MED ORDER — MELOXICAM 7.5 MG PO TABS: ORAL_TABLET | ORAL | Status: DC

## 2012-09-14 MED ORDER — AMOXICILLIN 500 MG PO CAPS: 500.0000 mg | ORAL_CAPSULE | Freq: Three times a day (TID) | ORAL | Status: DC

## 2012-09-14 MED ORDER — KETOROLAC TROMETHAMINE 10 MG PO TABS
10.0000 mg | ORAL_TABLET | Freq: Once | ORAL | Status: AC
  Administered 2012-09-14: 10 mg via ORAL
  Filled 2012-09-14: qty 1

## 2012-09-14 MED ORDER — DOXYCYCLINE HYCLATE 100 MG PO CAPS: 100.0000 mg | ORAL_CAPSULE | Freq: Two times a day (BID) | ORAL | Status: DC

## 2012-09-14 MED ORDER — DOXYCYCLINE HYCLATE 100 MG PO TABS
100.0000 mg | ORAL_TABLET | Freq: Once | ORAL | Status: AC
  Administered 2012-09-14: 100 mg via ORAL
  Filled 2012-09-14: qty 1

## 2012-09-14 MED ORDER — AMOXICILLIN 250 MG PO CAPS
500.0000 mg | ORAL_CAPSULE | Freq: Once | ORAL | Status: AC
  Administered 2012-09-14: 500 mg via ORAL
  Filled 2012-09-14: qty 2

## 2012-09-14 MED ORDER — ONDANSETRON HCL 4 MG PO TABS
4.0000 mg | ORAL_TABLET | Freq: Once | ORAL | Status: AC
  Administered 2012-09-14: 4 mg via ORAL
  Filled 2012-09-14: qty 1

## 2012-09-14 MED ORDER — BUPIVACAINE HCL (PF) 0.25 % IJ SOLN
20.0000 mL | Freq: Once | INTRAMUSCULAR | Status: AC
  Administered 2012-09-14: 20 mL

## 2012-09-14 NOTE — ED Notes (Signed)
Boil to left axillary, denies any drainage, fever today

## 2012-09-14 NOTE — ED Notes (Signed)
Boil/abcess to left axillary area, pt reports pain and fever, states present x1 week and that it has gotten worse over time, area is red and raised, 6-7cm around. No other complaints

## 2012-09-14 NOTE — ED Provider Notes (Signed)
History     CSN: [id number]  Arrival date & time 09/14/12  1715   First MD Initiated Contact with Patient 09/14/12 2002      Chief Complaint  Patient presents with  . Abscess    (Consider location/radiation/quality/duration/timing/severity/associated sxs/prior treatment) Patient is a 26 y.o. female presenting with abscess. The history is provided by the patient.  Abscess  This is a new problem. The current episode started more than one week ago. The problem occurs continuously. The problem has been gradually worsening. Affected Location: Left axilla. The problem is moderate. The abscess is characterized by redness and painfulness. It is unknown what she was exposed to. Pertinent negatives include no anorexia, no fever and no cough. There were no sick contacts. She has received no recent medical care.    Past Medical History  Diagnosis Date  . No pertinent past medical history   . Headache     Past Surgical History  Procedure Date  . No past surgeries     Family History  Problem Relation Age of Onset  . Anesthesia problems Neg Hx   . Muscular dystrophy Other   . Hypertension Mother   . Diabetes Father     History  Substance Use Topics  . Smoking status: Never Smoker   . Smokeless tobacco: Not on file  . Alcohol Use: No    OB History    Grav Para Term Preterm Abortions TAB SAB Ect Mult Living   3 2 2  0 1 0 1 0 0 2      Review of Systems  Constitutional: Negative for fever and activity change.       All ROS Neg except as noted in HPI  HENT: Negative for nosebleeds and neck pain.   Eyes: Negative for photophobia and discharge.  Respiratory: Negative for cough, shortness of breath and wheezing.   Cardiovascular: Negative for chest pain and palpitations.  Gastrointestinal: Negative for abdominal pain, blood in stool and anorexia.  Genitourinary: Negative for dysuria, frequency and hematuria.  Musculoskeletal: Negative for back pain and arthralgias.  Skin:     abscess  Neurological: Negative for dizziness, seizures and speech difficulty.  Psychiatric/Behavioral: Negative for hallucinations and confusion.    Allergies  Review of patient's allergies indicates no known allergies.  Home Medications   Current Outpatient Rx  Name  Route  Sig  Dispense  Refill  . PRENATAL MULTIVITAMIN CH   Oral   Take 1 tablet by mouth at bedtime.           BP 121/71  Pulse 89  Temp 98.7 F (37.1 C) (Oral)  Resp 18  Ht 5\' 4"  (1.626 m)  Wt 140 lb (63.504 kg)  BMI 24.03 kg/m2  SpO2 100%  LMP 07/15/2012  Breastfeeding? No  Physical Exam  Nursing note and vitals reviewed. Constitutional: She is oriented to person, place, and time. She appears well-developed and well-nourished.  Non-toxic appearance.  HENT:  Head: Normocephalic.  Right Ear: Tympanic membrane and external ear normal.  Left Ear: Tympanic membrane and external ear normal.  Eyes: EOM and lids are normal. Pupils are equal, round, and reactive to light.  Neck: Normal range of motion. Neck supple. Carotid bruit is not present.  Cardiovascular: Normal rate, regular rhythm, normal heart sounds, intact distal pulses and normal pulses.   Pulmonary/Chest: Breath sounds normal. No respiratory distress.  Abdominal: Soft. Bowel sounds are normal. There is no tenderness. There is no guarding.  Musculoskeletal: Normal range of motion.  Moderate sized abscess of the left axilla. Tender to palpation. No satellite abscess areas noted. No red streaking appreciated. The brachial and radial pulses are symmetrical.  Lymphadenopathy:       Head (right side): No submandibular adenopathy present.       Head (left side): No submandibular adenopathy present.    She has no cervical adenopathy.  Neurological: She is alert and oriented to person, place, and time. She has normal strength. No cranial nerve deficit or sensory deficit.  Skin: Skin is warm and dry.  Psychiatric: She has a normal mood and affect.  Her speech is normal.    ED Course  INCISION AND DRAINAGE Date/Time: 09/14/2012 8:45 PM Performed by: Kathie Dike Authorized by: Kathie Dike Consent: Verbal consent obtained. Risks and benefits: risks, benefits and alternatives were discussed Consent given by: patient Patient understanding: patient states understanding of the procedure being performed Patient identity confirmed: arm band Time out: Immediately prior to procedure a "time out" was called to verify the correct patient, procedure, equipment, support staff and site/side marked as required. Type: abscess Location: Left axilla. Anesthesia: local infiltration Local anesthetic: lidocaine 2% without epinephrine Patient sedated: no Scalpel size: 11 Incision type: single straight Complexity: simple Drainage: purulent Drainage amount: moderate Wound treatment: wound left open Patient tolerance: Patient tolerated the procedure well with no immediate complications.   (including critical care time)  Labs Reviewed - No data to display No results found.   No diagnosis found.    MDM  I have reviewed nursing notes, vital signs, and all appropriate lab and imaging results for this patient. Pt had a large abscess of the left axilla. Discussed options with pt, including I and D in ED vs surgical consult. Pt request to have I and D in ED. I and D carried out without major complications. Rx for amoxil, doxycycline, mobic and percocet given to the patient. Pt to begin tub soaks, and see the surgeon if not improving.       Kathie Dike, [place] 09/15/12 [phone]

## 2012-09-15 NOTE — ED Provider Notes (Signed)
Medical screening examination/treatment/procedure(s) were performed by non-physician practitioner and as supervising physician I was immediately available for consultation/collaboration.   Glynn Octave, MD 09/15/12 2134

## 2012-09-17 LAB — CULTURE, ROUTINE-ABSCESS

## 2012-09-21 MED FILL — Oxycodone w/ Acetaminophen Tab 5-325 MG: ORAL | Qty: 6 | Status: AC

## 2013-09-27 ENCOUNTER — Emergency Department (HOSPITAL_COMMUNITY)
Admission: EM | Admit: 2013-09-27 | Discharge: 2013-09-27 | Disposition: A | Discharge status: Home / Self Care | Payer: Medicaid Other | Attending: Emergency Medicine | Admitting: Emergency Medicine

## 2013-09-27 ENCOUNTER — Encounter (HOSPITAL_COMMUNITY): Payer: Self-pay | Admitting: Emergency Medicine

## 2013-09-27 DIAGNOSIS — K0889 Other specified disorders of teeth and supporting structures: Secondary | ICD-10-CM

## 2013-09-27 DIAGNOSIS — Z792 Long term (current) use of antibiotics: Secondary | ICD-10-CM | POA: Insufficient documentation

## 2013-09-27 DIAGNOSIS — Z79899 Other long term (current) drug therapy: Secondary | ICD-10-CM | POA: Insufficient documentation

## 2013-09-27 DIAGNOSIS — Z791 Long term (current) use of non-steroidal anti-inflammatories (NSAID): Secondary | ICD-10-CM | POA: Insufficient documentation

## 2013-09-27 DIAGNOSIS — K089 Disorder of teeth and supporting structures, unspecified: Secondary | ICD-10-CM | POA: Insufficient documentation

## 2013-09-27 MED ORDER — AMOXICILLIN 250 MG PO CAPS: 250.0000 mg | ORAL_CAPSULE | Freq: Three times a day (TID) | ORAL | Status: DC

## 2013-09-27 MED ORDER — NAPROXEN 500 MG PO TABS: 500.0000 mg | ORAL_TABLET | Freq: Two times a day (BID) | ORAL | Status: DC

## 2013-09-27 NOTE — ED Provider Notes (Signed)
Medical screening examination/treatment/procedure(s) were performed by non-physician practitioner and as supervising physician I was immediately available for consultation/collaboration.  EKG Interpretation   None         Benny Lennert, MD 09/27/13 1541

## 2013-09-27 NOTE — ED Provider Notes (Signed)
CSN: [id number]     Arrival date & time 09/27/13  1354 History   First MD Initiated Contact with Patient 09/27/13 1407     Chief Complaint  Patient presents with  . Dental Pain   (Consider location/radiation/quality/duration/timing/severity/associated sxs/prior Treatment) Patient is a 27 y.o. female presenting with tooth pain. The history is provided by the patient.  Dental Pain Location:  Lower Lower teeth location:  32/RL 3rd molar Quality:  Throbbing and constant Severity:  Moderate Onset quality:  Gradual Duration:  3 days Timing:  Constant Progression:  Worsening Chronicity:  New Context comment:  Cracked Relieved by:  Nothing Worsened by:  Pressure and cold food/drink Ineffective treatments:  None tried  Victoria Soto is a 27 y.o. female who presents to the ED with pain in the lower right dental area. She has had problems with her wisdom tooth off and on for the past year. The dentist told her it is coming in sideways and she will need to have it out before it will get better. Last night she chipped a tooth in the back and the pain increased. She called her dentist and has an appointment for 10/07/2013. She needed something before then.she denies fever, chills, nausea, vomiting or any other problems.  Past Medical History  Diagnosis Date  . No pertinent past medical history   . [id number](478.2)    Past Surgical History  Procedure Laterality Date  . No past surgeries     Family History  Problem Relation Age of Onset  . Anesthesia problems Neg Hx   . Muscular dystrophy Other   . Hypertension Mother   . Diabetes Father    History  Substance Use Topics  . Smoking status: Never Smoker   . Smokeless tobacco: Not on file  . Alcohol Use: No   OB History   Grav Para Term Preterm Abortions TAB SAB Ect Mult Living   3 2 2  0 1 0 1 0 0 2     Review of Systems Negative except as stated in HPI  Allergies  Review of patient's allergies indicates no known  allergies.  Home Medications   Current Outpatient Rx  Name  Route  Sig  Dispense  Refill  . amoxicillin (AMOXIL) 500 MG capsule   Oral   Take 1 capsule (500 mg total) by mouth 3 (three) times daily.   21 capsule   0   . doxycycline (VIBRAMYCIN) 100 MG capsule   Oral   Take 1 capsule (100 mg total) by mouth 2 (two) times daily.   14 capsule   0   . megestrol (MEGACE) 40 MG tablet   Oral   Take 40-120 mg by mouth daily. Take three tablets daily (120mg  total) for 5 days, then take two tablets daily (80mf total) for 5 days, then take one tablet daily (40mg  total) for 5 days,  then stop.         . meloxicam (MOBIC) 7.5 MG tablet      1 PO BID WITH FOOD   14 tablet   0   . oxyCODONE-acetaminophen (PERCOCET/ROXICET) 5-325 MG per tablet   Oral   Take 1 tablet by mouth every 6 (six) hours as needed for pain.   20 tablet   0   . oxyCODONE-acetaminophen (PERCOCET/ROXICET) 5-325 MG per tablet   Oral   Take 1 tablet by mouth every 6 (six) hours as needed for pain.   6 tablet   0   . Prenatal Vit-Fe Fumarate-FA (PRENATAL  MULTIVITAMIN) TABS   Oral   Take 1 tablet by mouth at bedtime.          BP 109/74  Pulse 99  Temp(Src) 98.4 F (36.9 C) (Oral)  Resp 20  Ht 5\' 4"  (1.626 m)  Wt 130 lb (58.968 kg)  BMI 22.30 kg/m2  SpO2 100%  LMP 07/28/2012  Breastfeeding? No Physical Exam  Nursing note and vitals reviewed. Constitutional: She is oriented to person, place, and time. She appears well-developed and well-nourished.  HENT:  Head: Normocephalic and atraumatic.  Mouth/Throat: Uvula is midline, oropharynx is clear and moist and mucous membranes are normal. No oral lesions. No trismus in the jaw.    Tender with gum swelling around the right lower third molar.   Eyes: EOM are normal.  Neck: Neck supple.  Cardiovascular: Normal rate.   Pulmonary/Chest: Effort normal.  Abdominal: Soft. There is no tenderness.  Musculoskeletal: Normal range of motion.  Lymphadenopathy:     She has no cervical adenopathy.  Neurological: She is alert and oriented to person, place, and time. No cranial nerve deficit.  Skin: Skin is warm and dry.  Psychiatric: She has a normal mood and affect. Her behavior is normal.    ED Course  Procedures  EKG Interpretation   None       MDM  27 y.o. female with pain in the right lower third molar. Will treat with antibiotics and NSAIDS and she will see her dentist as scheduled.  Discussed with the patient and all questioned fully answered. She is stable for discharge without any immediate complications.    Medication List    STOP taking these medications       doxycycline 100 MG capsule  Commonly known as:  VIBRAMYCIN      TAKE these medications       amoxicillin 250 MG capsule  Commonly known as:  AMOXIL  Take 1 capsule (250 mg total) by mouth 3 (three) times daily.     naproxen 500 MG tablet  Commonly known as:  NAPROSYN  Take 1 tablet (500 mg total) by mouth 2 (two) times daily.      ASK your doctor about these medications       megestrol 40 MG tablet  Commonly known as:  MEGACE  Take 40-120 mg by mouth daily. Take three tablets daily (120mg  total) for 5 days, then take two tablets daily (80mf total) for 5 days, then take one tablet daily (40mg  total) for 5 days,  then stop.     meloxicam 7.5 MG tablet  Commonly known as:  MOBIC  1 PO BID WITH FOOD     oxyCODONE-acetaminophen 5-325 MG per tablet  Commonly known as:  PERCOCET/ROXICET  Take 1 tablet by mouth every 6 (six) hours as needed for pain.     oxyCODONE-acetaminophen 5-325 MG per tablet  Commonly known as:  PERCOCET/ROXICET  Take 1 tablet by mouth every 6 (six) hours as needed for pain.     prenatal multivitamin Tabs tablet  Take 1 tablet by mouth at bedtime.         [facility] Orlene Och, [place] 09/27/13 [phone]

## 2013-09-27 NOTE — ED Notes (Signed)
Patient with no complaints at this time. Respirations even and unlabored. Skin warm/dry. Discharge instructions reviewed with patient at this time. Patient given opportunity to voice concerns/ask questions. Patient discharged at this time and left Emergency Department with steady gait.   

## 2013-09-27 NOTE — ED Notes (Signed)
Rt mandibular dental pain since Saturday.

## 2014-07-09 ENCOUNTER — Encounter (HOSPITAL_COMMUNITY): Payer: Self-pay | Admitting: Emergency Medicine

## 2014-07-09 ENCOUNTER — Emergency Department (HOSPITAL_COMMUNITY)
Admission: EM | Admit: 2014-07-09 | Discharge: 2014-07-09 | Disposition: A | Discharge status: Home / Self Care | Payer: Medicaid Other | Attending: Emergency Medicine | Admitting: Emergency Medicine

## 2014-07-09 DIAGNOSIS — K089 Disorder of teeth and supporting structures, unspecified: Secondary | ICD-10-CM | POA: Insufficient documentation

## 2014-07-09 DIAGNOSIS — K047 Periapical abscess without sinus: Secondary | ICD-10-CM | POA: Insufficient documentation

## 2014-07-09 MED ORDER — AMOXICILLIN 250 MG PO CAPS
500.0000 mg | ORAL_CAPSULE | Freq: Once | ORAL | Status: AC
  Administered 2014-07-09: 500 mg via ORAL
  Filled 2014-07-09: qty 2

## 2014-07-09 MED ORDER — TRAMADOL HCL 50 MG PO TABS
50.0000 mg | ORAL_TABLET | Freq: Once | ORAL | Status: AC
  Administered 2014-07-09: 50 mg via ORAL
  Filled 2014-07-09: qty 1

## 2014-07-09 MED ORDER — AMOXICILLIN 500 MG PO CAPS: 500.0000 mg | ORAL_CAPSULE | Freq: Three times a day (TID) | ORAL | Status: DC

## 2014-07-09 MED ORDER — TRAMADOL HCL 50 MG PO TABS: 50.0000 mg | ORAL_TABLET | Freq: Four times a day (QID) | ORAL | Status: DC | PRN

## 2014-07-09 MED ORDER — IBUPROFEN 600 MG PO TABS: 600.0000 mg | ORAL_TABLET | Freq: Four times a day (QID) | ORAL | Status: DC | PRN

## 2014-07-09 NOTE — ED Provider Notes (Signed)
CSN: [id number]     Arrival date & time 07/09/14  1812 History   First MD Initiated Contact with Patient 07/09/14 1822     No chief complaint on file.    (Consider location/radiation/quality/duration/timing/severity/associated sxs/prior Treatment) The history is provided by the patient.   Victoria Soto is a 28 y.o. female presenting with a 1 week history of worsening left upper and lower wisdom tooth dental pain and gingival swelling.    There has been no fevers, chills, nausea or vomiting, also no complaint of difficulty swallowing, although chewing makes pain worse.  The patient has tried ibuprofen and ambesol without relief of symptoms.         Past Medical History  Diagnosis Date  . No pertinent past medical history   . SLHTDSKA(768.1)    Past Surgical History  Procedure Laterality Date  . No past surgeries     Family History  Problem Relation Age of Onset  . Anesthesia problems Neg Hx   . Muscular dystrophy Other   . Hypertension Mother   . Diabetes Father    History  Substance Use Topics  . Smoking status: Never Smoker   . Smokeless tobacco: Not on file  . Alcohol Use: No   OB History   Grav Para Term Preterm Abortions TAB SAB Ect Mult Living   3 2 2  0 1 0 1 0 0 2     Review of Systems  Constitutional: Negative for fever.  HENT: Positive for dental problem. Negative for facial swelling and sore throat.   Respiratory: Negative for shortness of breath.   Musculoskeletal: Negative for neck pain and neck stiffness.      Allergies  Review of patient's allergies indicates no known allergies.  Home Medications   Prior to Admission medications   Medication Sig Start Date End Date Taking? Authorizing Provider  amoxicillin (AMOXIL) 500 MG capsule Take 1 capsule (500 mg total) by mouth 3 (three) times daily. 07/09/14   Burgess Amor, PA-C  ibuprofen (ADVIL,MOTRIN) 600 MG tablet Take 1 tablet (600 mg total) by mouth every 6 (six) hours as needed. 07/09/14   Burgess Amor, PA-C  traMADol (ULTRAM) 50 MG tablet Take 1 tablet (50 mg total) by mouth every 6 (six) hours as needed. 07/09/14   Burgess Amor, PA-C   BP 110/77  Pulse 69  Temp(Src) 98.1 F (36.7 C) (Oral)  Resp 18  Ht 5\' 4"  (1.626 m)  Wt 120 lb (54.432 kg)  BMI 20.59 kg/m2  SpO2 100% Physical Exam  Constitutional: She is oriented to person, place, and time. She appears well-developed and well-nourished. No distress.  HENT:  Head: Normocephalic and atraumatic.  Right Ear: Tympanic membrane and external ear normal.  Left Ear: Tympanic membrane and external ear normal.  Mouth/Throat: Oropharynx is clear and moist and mucous membranes are normal. No oral lesions. No trismus in the jaw. Dental abscesses present.  ttp with mild lateral surrounding gingival edema left upper 3rd molar.  Pt's left lower third molar has erupted pointing anteriorly, but does not appear to be abscessed. No facial edema or erythema.  Eyes: Conjunctivae are normal.  Neck: Normal range of motion. Neck supple.  Cardiovascular: Normal rate and normal heart sounds.   Pulmonary/Chest: Effort normal.  Abdominal: She exhibits no distension.  Musculoskeletal: Normal range of motion.  Lymphadenopathy:    She has no cervical adenopathy.  Neurological: She is alert and oriented to person, place, and time.  Skin: Skin is warm and dry. No  erythema.  Psychiatric: She has a normal mood and affect.    ED Course  Procedures (including critical care time) Labs Review Labs Reviewed - No data to display  Imaging Review No results found.   EKG Interpretation None      MDM   Final diagnoses:  Dental abscess    Amoxil, ultram, ibuprofen.  F/u with dentist.    The patient appears reasonably screened and/or stabilized for discharge and I doubt any other medical condition or other [facility] requiring further screening, evaluation, or treatment in the ED at this time prior to discharge.     Burgess Amor, PA-C 07/09/14 1907

## 2014-07-09 NOTE — Discharge Instructions (Signed)
Dental Abscess A dental abscess is a collection of infected fluid (pus) from a bacterial infection in the inner part of the tooth (pulp). It usually occurs at the end of the tooth's root.  CAUSES   Severe tooth decay.  Trauma to the tooth that allows bacteria to enter into the pulp, such as a broken or chipped tooth. SYMPTOMS   Severe pain in and around the infected tooth.  Swelling and redness around the abscessed tooth or in the mouth or face.  Tenderness.  Pus drainage.  Bad breath.  Bitter taste in the mouth.  Difficulty swallowing.  Difficulty opening the mouth.  Nausea.  Vomiting.  Chills.  Swollen neck glands. DIAGNOSIS   A medical and dental history will be taken.  An examination will be performed by tapping on the abscessed tooth.  X-rays may be taken of the tooth to identify the abscess. TREATMENT The goal of treatment is to eliminate the infection. You may be prescribed antibiotic medicine to stop the infection from spreading. A root canal may be performed to save the tooth. If the tooth cannot be saved, it may be pulled (extracted) and the abscess may be drained.  HOME CARE INSTRUCTIONS  Only take over-the-counter or prescription medicines for pain, fever, or discomfort as directed by your caregiver.  Rinse your mouth (gargle) often with salt water ( tsp salt in 8 oz [250 ml] of warm water) to relieve pain or swelling.  Do not drive after taking pain medicine (narcotics).  Do not apply heat to the outside of your face.  Return to your dentist for further treatment as directed. SEEK MEDICAL CARE IF:  Your pain is not helped by medicine.  Your pain is getting worse instead of better. SEEK IMMEDIATE MEDICAL CARE IF:  You have a fever or persistent symptoms for more than 2-3 days.  You have a fever and your symptoms suddenly get worse.  You have chills or a very bad headache.  You have problems breathing or swallowing.  You have trouble  opening your mouth.  You have swelling in the neck or around the eye. Document Released: 10/21/2005 Document Revised: 07/15/2012 Document Reviewed: 01/29/2011 [facility] Patient Information 2015 [place], [place]. This information is not intended to replace advice given to you by your health care provider. Make sure you discuss any questions you have with your health care provider.   Complete your entire course of antibiotics as prescribed.  You  may use the ultram for pain relief but do not drive within 4 hours of taking as this will make you drowsy.  Avoid applying heat or ice to this abscess area which can worsen your symptoms.  You may use warm salt water swish and spit treatment or half peroxide and water swish and spit after meals to keep this area clean as discussed.  Call the dentist listed above for further management of your symptoms.

## 2014-07-09 NOTE — ED Notes (Signed)
Dental pain at left wisdom tooth since Saturday.

## 2014-07-09 NOTE — ED Provider Notes (Signed)
Medical screening examination/treatment/procedure(s) were performed by non-physician practitioner and as supervising physician I was immediately available for consultation/collaboration.   EKG Interpretation None      Devoria Albe, MD, Armando Gang   Ward Givens, MD 07/09/14 [phone]

## 2014-09-05 ENCOUNTER — Encounter (HOSPITAL_COMMUNITY): Payer: Self-pay | Admitting: Emergency Medicine

## 2015-01-07 ENCOUNTER — Emergency Department (HOSPITAL_COMMUNITY)
Admission: EM | Admit: 2015-01-07 | Discharge: 2015-01-07 | Disposition: A | Discharge status: Home / Self Care | Payer: Self-pay | Attending: Emergency Medicine | Admitting: Emergency Medicine

## 2015-01-07 ENCOUNTER — Emergency Department (HOSPITAL_COMMUNITY): Payer: Self-pay

## 2015-01-07 ENCOUNTER — Encounter (HOSPITAL_COMMUNITY): Payer: Self-pay | Admitting: Emergency Medicine

## 2015-01-07 DIAGNOSIS — D72829 Elevated white blood cell count, unspecified: Secondary | ICD-10-CM | POA: Insufficient documentation

## 2015-01-07 DIAGNOSIS — R1013 Epigastric pain: Secondary | ICD-10-CM | POA: Insufficient documentation

## 2015-01-07 DIAGNOSIS — Z792 Long term (current) use of antibiotics: Secondary | ICD-10-CM | POA: Insufficient documentation

## 2015-01-07 LAB — CBC WITH DIFFERENTIAL/PLATELET
BASOS PCT: 0 % (ref 0–1)
Basophils Absolute: 0 10*3/uL (ref 0.0–0.1)
Eosinophils Absolute: 0.2 10*3/uL (ref 0.0–0.7)
Eosinophils Relative: 2 % (ref 0–5)
HCT: 38 % (ref 36.0–46.0)
HEMOGLOBIN: 13.1 g/dL (ref 12.0–15.0)
Lymphocytes Relative: 14 % (ref 12–46)
Lymphs Abs: 1.6 10*3/uL (ref 0.7–4.0)
MCH: 31 pg (ref 26.0–34.0)
MCHC: 34.5 g/dL (ref 30.0–36.0)
MCV: 90 fL (ref 78.0–100.0)
MONO ABS: 0.9 10*3/uL (ref 0.1–1.0)
MONOS PCT: 8 % (ref 3–12)
NEUTROS ABS: 8.7 10*3/uL — AB (ref 1.7–7.7)
NEUTROS PCT: 76 % (ref 43–77)
Platelets: 210 10*3/uL (ref 150–400)
RBC: 4.22 MIL/uL (ref 3.87–5.11)
RDW: 12.1 % (ref 11.5–15.5)
WBC: 11.4 10*3/uL — AB (ref 4.0–10.5)

## 2015-01-07 LAB — COMPREHENSIVE METABOLIC PANEL
ALT: 10 U/L (ref 0–35)
AST: 13 U/L (ref 0–37)
Albumin: 4.4 g/dL (ref 3.5–5.2)
Alkaline Phosphatase: 62 U/L (ref 39–117)
Anion gap: 6 (ref 5–15)
BUN: 14 mg/dL (ref 6–23)
CO2: 25 mmol/L (ref 19–32)
Calcium: 8.5 mg/dL (ref 8.4–10.5)
Chloride: 105 mmol/L (ref 96–112)
Creatinine, Ser: 0.58 mg/dL (ref 0.50–1.10)
GFR calc Af Amer: >90 mL/min (ref >90)
GLUCOSE: 108 mg/dL — AB (ref 70–99)
Potassium: 3.3 mmol/L — ABNORMAL LOW (ref 3.5–5.1)
Sodium: 136 mmol/L (ref 135–145)
Total Bilirubin: 1.3 mg/dL — ABNORMAL HIGH (ref 0.3–1.2)
Total Protein: 7.3 g/dL (ref 6.0–8.3)

## 2015-01-07 LAB — LIPASE, BLOOD: Lipase: 19 U/L (ref 11–59)

## 2015-01-07 MED ORDER — GI COCKTAIL ~~LOC~~
30.0000 mL | Freq: Once | ORAL | Status: AC
  Administered 2015-01-07: 30 mL via ORAL
  Filled 2015-01-07: qty 30

## 2015-01-07 MED ORDER — OMEPRAZOLE 20 MG PO CPDR: 20.0000 mg | DELAYED_RELEASE_CAPSULE | Freq: Two times a day (BID) | ORAL | Status: DC

## 2015-01-07 MED ORDER — TRAMADOL HCL 50 MG PO TABS: 50.0000 mg | ORAL_TABLET | Freq: Four times a day (QID) | ORAL | Status: DC | PRN

## 2015-01-07 NOTE — Discharge Instructions (Signed)
Prilosec as prescribed.  Tramadol as prescribed as needed for pain.  Return to the emergency department if you develop acutely worsening pain, high fever, bloody stool or vomit, or other new and concerning symptoms.   Abdominal Pain Many things can cause abdominal pain. Usually, abdominal pain is not caused by a disease and will improve without treatment. It can often be observed and treated at home. Your health care provider will do a physical exam and possibly order blood tests and X-rays to help determine the seriousness of your pain. However, in many cases, more time must pass before a clear cause of the pain can be found. Before that point, your health care provider may not know if you need more testing or further treatment. HOME CARE INSTRUCTIONS  Monitor your abdominal pain for any changes. The following actions may help to alleviate any discomfort you are experiencing:  Only take over-the-counter or prescription medicines as directed by your health care provider.  Do not take laxatives unless directed to do so by your health care provider.  Try a clear liquid diet (broth, tea, or water) as directed by your health care provider. Slowly move to a bland diet as tolerated. SEEK MEDICAL CARE IF:  You have unexplained abdominal pain.  You have abdominal pain associated with nausea or diarrhea.  You have pain when you urinate or have a bowel movement.  You experience abdominal pain that wakes you in the night.  You have abdominal pain that is worsened or improved by eating food.  You have abdominal pain that is worsened with eating fatty foods.  You have a fever. SEEK IMMEDIATE MEDICAL CARE IF:   Your pain does not go away within 2 hours.  You keep throwing up (vomiting).  Your pain is felt only in portions of the abdomen, such as the right side or the left lower portion of the abdomen.  You pass bloody or black tarry stools. MAKE SURE YOU:  Understand these instructions.    Will watch your condition.   Will get help right away if you are not doing well or get worse.  Document Released: 07/31/2005 Document Revised: 10/26/2013 Document Reviewed: 06/30/2013 [facility] Patient Information 2015 [place], [place]. This information is not intended to replace advice given to you by your health care provider. Make sure you discuss any questions you have with your health care provider.

## 2015-01-07 NOTE — ED Notes (Signed)
PT c/o epigastric sharp pains with chest pain and increased acid reflux x1 day. PT denies any n/v/d, or urinary symptoms.

## 2015-01-07 NOTE — ED Provider Notes (Signed)
CSN: [id number]     Arrival date & time 01/07/15  7829 History  This chart was scribed for Geoffery Lyons, MD by Roxy Cedar, ED Scribe. This patient was seen in room APA03/APA03 and the patient's care was started at 8:13 AM.   Chief Complaint  Patient presents with  . Abdominal Pain   Patient is a 29 y.o. female presenting with abdominal pain. The history is provided by the patient. No language interpreter was used.  Abdominal Pain  HPI Comments: Victoria Soto is a 29 y.o. female with a PMHx of headaches, who presents to the Emergency Department complaining of moderate sharp epigastric pain that began yesterday.  Patient reportsthat she had onset of indigestion 2 days ago. She tried using pepto bismol and tums with no relief. She reports she had onset of increased sharp pain to epigastric and periumbilical pain that began last night. She states that the pain has been constant with worsening pain onset last night. She denies associated constipation or diarrhea. She reports normal bowel movements. She denies hx of abdominal or GI surgeries in the past. She states that she has an IUD inserted and her LNMP was in 2013 after delivering her son. She denies chance of pregnancy. She denies hx of similar symptoms in the past.  Past Medical History  Diagnosis Date  . No pertinent past medical history   . FAOZHYQM(578.4)    Past Surgical History  Procedure Laterality Date  . No past surgeries     Family History  Problem Relation Age of Onset  . Anesthesia problems Neg Hx   . Muscular dystrophy Other   . Hypertension Mother   . Diabetes Father    History  Substance Use Topics  . Smoking status: Never Smoker   . Smokeless tobacco: Not on file  . Alcohol Use: No   OB History    Gravida Para Term Preterm AB TAB SAB Ectopic Multiple Living   0 1 0 1 0 0 2     Review of Systems  Gastrointestinal: Positive for abdominal pain.   A complete 10 system review of systems was obtained  and all systems are negative except as noted in the HPI and PMH.   Allergies  Review of patient's allergies indicates no known allergies.  Home Medications   Prior to Admission medications   Medication Sig Start Date End Date Taking? Authorizing Provider  amoxicillin (AMOXIL) 500 MG capsule Take 1 capsule (500 mg total) by mouth 3 (three) times daily. 07/09/14   Burgess Amor, PA-C  ibuprofen (ADVIL,MOTRIN) 600 MG tablet Take 1 tablet (600 mg total) by mouth every 6 (six) hours as needed. 07/09/14   Burgess Amor, PA-C  traMADol (ULTRAM) 50 MG tablet Take 1 tablet (50 mg total) by mouth every 6 (six) hours as needed. 07/09/14   Burgess Amor, PA-C   Triage Vitals: BP 117/78 mmHg  Pulse 86  Temp(Src) 97.8 F (36.6 C) (Oral)  Resp 20  Ht  (1.626 m)  Wt 120 lb (54.432 kg)  BMI 20.59 kg/m2  SpO2 100%  Physical Exam  Constitutional: She is oriented to person, place, and time. She appears well-developed and well-nourished. No distress.  HENT:  Head: Normocephalic and atraumatic.  Mouth/Throat: Oropharynx is clear and moist.  Eyes: EOM are normal. Pupils are equal, round, and reactive to light.  Neck: Normal range of motion. Neck supple.  Cardiovascular: Normal rate and regular rhythm.  Exam reveals no friction rub.   No  murmur heard. Pulmonary/Chest: Effort normal and breath sounds normal. No respiratory distress. She has no wheezes. She has no rales.  Abdominal: Soft. She exhibits no distension. There is tenderness. There is no rebound.  Tender to palpation in the epigastric region.   Musculoskeletal: Normal range of motion. She exhibits no edema.  Neurological: She is alert and oriented to person, place, and time.  Skin: She is not diaphoretic.  Nursing note and vitals reviewed.  ED Course  Procedures (including critical care time)  DIAGNOSTIC STUDIES: Oxygen Saturation is 100% on RA, normal by my interpretation.    COORDINATION OF CARE: 8:17 AM- Discussed plans to order diagnostic  lab work, and EKG. Will give patient GI cocktail. Pt advised of plan for treatment and pt agrees.  Labs Review Labs Reviewed  CBC WITH DIFFERENTIAL/PLATELET  COMPREHENSIVE METABOLIC PANEL   Imaging Review No results found.   EKG Interpretation   Date/Time:  Saturday January 07 2015 08:07:48 EST Ventricular Rate:  78 PR Interval:  158 QRS Duration: 94 QT Interval:  401 QTC Calculation: 457 R Axis:   61 Text Interpretation:  Sinus rhythm Normal ECG Confirmed by DELOS  MD,  Harl Wiechmann ([id number]) on 01/07/2015 8:31:16 AM     MDM   Final diagnoses:  None    Patient is a 29 year old female who presents with complaints of epigastric pain. She is tender in the epigastrium with no rebound and no guarding. [name] sign is absent. Workup reveals a mild elevation of white count, but no other significant abnormalities. Her LFTs are normal and lipase is 19. She is given a GI cocktail, however her discomfort persisted. She then underwent an ultrasound of the abdomen to evaluate her gallbladder. This did not reveal gallstones or other significant pathology. She appears quite comfortable and I see nothing in the workup indicates anything emergent. She believes she has heartburn. I will treat with Prilosec, tramadol, and when necessary return.  There is no tenderness to palpation in the lower abdomen and there is nothing in the history or exam that makes me feel like this could be related to appendicitis.  I personally performed the services described in this documentation, which was scribed in my presence. The recorded information has been reviewed and is accurate.     Geoffery Lyons, MD 01/07/15 [phone]

## 2015-01-07 NOTE — ED Notes (Signed)
PT in ultrasound at this time 

## 2015-01-07 NOTE — ED Notes (Signed)
MD at bedside. 

## 2015-04-13 ENCOUNTER — Encounter (HOSPITAL_COMMUNITY): Payer: Self-pay

## 2015-04-13 ENCOUNTER — Emergency Department (HOSPITAL_COMMUNITY)
Admission: EM | Admit: 2015-04-13 | Discharge: 2015-04-13 | Disposition: A | Discharge status: Home / Self Care | Payer: Self-pay | Attending: Emergency Medicine | Admitting: Emergency Medicine

## 2015-04-13 DIAGNOSIS — K047 Periapical abscess without sinus: Secondary | ICD-10-CM | POA: Insufficient documentation

## 2015-04-13 DIAGNOSIS — Z79899 Other long term (current) drug therapy: Secondary | ICD-10-CM | POA: Insufficient documentation

## 2015-04-13 DIAGNOSIS — K029 Dental caries, unspecified: Secondary | ICD-10-CM | POA: Insufficient documentation

## 2015-04-13 MED ORDER — AMOXICILLIN 500 MG PO CAPS: 500.0000 mg | ORAL_CAPSULE | Freq: Three times a day (TID) | ORAL | Status: AC

## 2015-04-13 NOTE — ED Notes (Signed)
Pt states she has swelling due to an abscessed tooth

## 2015-04-13 NOTE — Discharge Instructions (Signed)

## 2015-04-13 NOTE — ED Provider Notes (Signed)
CSN: [id number]     Arrival date & time 04/13/15  4098 History   First MD Initiated Contact with Patient 04/13/15 [phone]     Chief Complaint  Patient presents with  . Oral Swelling     (Consider location/radiation/quality/duration/timing/severity/associated sxs/prior Treatment) The history is provided by the patient.   Victoria Soto is a 29 y.o. female presenting with a 2 day history of left cheek and gingival swelling but denies significant pain at the site.   The patient has a history of decay in the teeth involved.  There has been no fevers, chills, nausea or vomiting, also no complaint of difficulty swallowing, although chewing makes pain worse.  The patient has taken no medicines for her symptoms.  She does have a local dentist whom she plans to contact for further care.      Past Medical History  Diagnosis Date  . No pertinent past medical history   . YNWGNFAO(130.8)    Past Surgical History  Procedure Laterality Date  . No past surgeries     Family History  Problem Relation Age of Onset  . Anesthesia problems Neg Hx   . Muscular dystrophy Other   . Hypertension Mother   . Diabetes Father    History  Substance Use Topics  . Smoking status: Never Smoker   . Smokeless tobacco: Not on file  . Alcohol Use: No   OB History    Gravida Para Term Preterm AB TAB SAB Ectopic Multiple Living   0 1 0 1 0 0 2     Review of Systems  Constitutional: Negative for fever.  HENT: Positive for dental problem. Negative for facial swelling and sore throat.   Respiratory: Negative for shortness of breath.   Musculoskeletal: Negative for neck pain and neck stiffness.      Allergies  Review of patient's allergies indicates no known allergies.  Home Medications   Prior to Admission medications   Medication Sig Start Date End Date Taking? Authorizing Provider  amoxicillin (AMOXIL) 500 MG capsule Take 1 capsule (500 mg total) by mouth 3 (three) times daily. 04/13/15 04/23/15   Burgess Amor, PA-C  ibuprofen (ADVIL,MOTRIN) 600 MG tablet Take 1 tablet (600 mg total) by mouth every 6 (six) hours as needed. Patient not taking: Reported on 01/07/2015 07/09/14   Burgess Amor, PA-C  omeprazole (PRILOSEC) 20 MG capsule Take 1 capsule (20 mg total) by mouth 2 (two) times daily. 01/07/15   Geoffery Lyons, MD  traMADol (ULTRAM) 50 MG tablet Take 1 tablet (50 mg total) by mouth every 6 (six) hours as needed. 01/07/15   Geoffery Lyons, MD   BP 113/79 mmHg  Pulse 66  Temp(Src) 97.6 F (36.4 C) (Oral)  Resp 16  Ht  (1.626 m)  Wt 125 lb (56.7 kg)  BMI 21.45 kg/m2  SpO2 100% Physical Exam  Constitutional: She is oriented to person, place, and time. She appears well-developed and well-nourished. No distress.  HENT:  Head: Normocephalic and atraumatic.  Right Ear: Tympanic membrane and external ear normal.  Left Ear: Tympanic membrane and external ear normal.  Mouth/Throat: Oropharynx is clear and moist and mucous membranes are normal. No oral lesions. No trismus in the jaw. Dental abscesses and dental caries present.    Sublingual space is soft. Facial edema along left lateral mandible.  No erythema.  Soft, no induration.  Eyes: Conjunctivae are normal.  Neck: Normal range of motion. Neck supple.  Cardiovascular: Normal rate and normal heart  sounds.   Pulmonary/Chest: Effort normal.  Musculoskeletal: Normal range of motion.  Lymphadenopathy:    She has no cervical adenopathy.  Neurological: She is alert and oriented to person, place, and time.  Skin: Skin is warm and dry. No erythema.  Psychiatric: She has a normal mood and affect.    ED Course  Procedures (including critical care time) Labs Review Labs Reviewed - No data to display  Imaging Review No results found.   EKG Interpretation None      MDM   Final diagnoses:  Dental abscess    Amoxil, f/u with dentist.  She plans to call today for appt.     Burgess Amor, PA-C 04/13/15 1610  Donnetta Hutching,  MD 04/13/15 [phone]

## 2015-04-13 NOTE — ED Notes (Signed)
Pt arguing on phone during nurse assessment.  Pt given call light and instructed on use.  Pt states "my tooth hurts" but continued with conversation on phone.

## 2015-04-17 ENCOUNTER — Encounter (HOSPITAL_COMMUNITY): Payer: Self-pay | Admitting: *Deleted

## 2015-04-17 ENCOUNTER — Emergency Department (HOSPITAL_COMMUNITY)
Admission: EM | Admit: 2015-04-17 | Discharge: 2015-04-17 | Disposition: A | Discharge status: Home / Self Care | Payer: Self-pay | Attending: Emergency Medicine | Admitting: Emergency Medicine

## 2015-04-17 DIAGNOSIS — K029 Dental caries, unspecified: Secondary | ICD-10-CM | POA: Insufficient documentation

## 2015-04-17 DIAGNOSIS — Z79899 Other long term (current) drug therapy: Secondary | ICD-10-CM | POA: Insufficient documentation

## 2015-04-17 DIAGNOSIS — Z792 Long term (current) use of antibiotics: Secondary | ICD-10-CM | POA: Insufficient documentation

## 2015-04-17 DIAGNOSIS — K047 Periapical abscess without sinus: Secondary | ICD-10-CM | POA: Insufficient documentation

## 2015-04-17 MED ORDER — IBUPROFEN 800 MG PO TABS
800.0000 mg | ORAL_TABLET | Freq: Once | ORAL | Status: AC
  Administered 2015-04-17: 800 mg via ORAL
  Filled 2015-04-17: qty 1

## 2015-04-17 MED ORDER — CEFTRIAXONE SODIUM 1 G IJ SOLR
1.0000 g | Freq: Once | INTRAMUSCULAR | Status: AC
  Administered 2015-04-17: 1 g via INTRAMUSCULAR
  Filled 2015-04-17: qty 10

## 2015-04-17 MED ORDER — LIDOCAINE HCL (PF) 1 % IJ SOLN
INTRAMUSCULAR | Status: AC
  Filled 2015-04-17: qty 5

## 2015-04-17 NOTE — ED Provider Notes (Addendum)
CSN: [id number]     Arrival date & time 04/17/15  1928 History   First MD Initiated Contact with Patient 04/17/15 2038     Chief Complaint  Patient presents with  . abscessed tooth      (Consider location/radiation/quality/duration/timing/severity/associated sxs/prior Treatment) Patient is a 29 y.o. female presenting with tooth pain.  Dental Pain Location:  Lower Quality:  Shooting and throbbing Severity:  Severe Onset quality:  Gradual Duration:  10 days Timing:  Intermittent Progression:  Worsening Chronicity:  Chronic Context: dental caries and poor dentition   Relieved by:  Nothing Worsened by:  Cold food/drink Ineffective treatments: antibiotics. Associated symptoms: facial pain and gum swelling   Associated symptoms: no difficulty swallowing, no drooling, no fever, no neck pain and no trismus   Risk factors: lack of dental care   Risk factors: no diabetes and no smoking     Past Medical History  Diagnosis Date  . No pertinent past medical history   . WUJWJXBJ(478.2)    Past Surgical History  Procedure Laterality Date  . No past surgeries     Family History  Problem Relation Age of Onset  . Anesthesia problems Neg Hx   . Muscular dystrophy Other   . Hypertension Mother   . Diabetes Father    History  Substance Use Topics  . Smoking status: Never Smoker   . Smokeless tobacco: Not on file  . Alcohol Use: No   OB History    Gravida Para Term Preterm AB TAB SAB Ectopic Multiple Living   0 1 0 1 0 0 2     Review of Systems  Constitutional: Negative for fever, activity change and appetite change.       All ROS Neg except as noted in HPI  HENT: Positive for dental problem. Negative for drooling and nosebleeds.   Eyes: Negative for photophobia and discharge.  Respiratory: Negative for cough, shortness of breath and wheezing.   Cardiovascular: Negative for chest pain and palpitations.  Gastrointestinal: Negative for abdominal pain and blood in stool.   Genitourinary: Negative for dysuria, frequency and hematuria.  Musculoskeletal: Negative for back pain, arthralgias and neck pain.  Skin: Negative.   Neurological: Negative for dizziness, seizures and speech difficulty.  Psychiatric/Behavioral: Negative for hallucinations and confusion.      Allergies  Review of patient's allergies indicates no known allergies.  Home Medications   Prior to Admission medications   Medication Sig Start Date End Date Taking? Authorizing Provider  amoxicillin (AMOXIL) 500 MG capsule Take 1 capsule (500 mg total) by mouth 3 (three) times daily. 04/13/15 04/23/15  Burgess Amor, PA-C  ibuprofen (ADVIL,MOTRIN) 600 MG tablet Take 1 tablet (600 mg total) by mouth every 6 (six) hours as needed. Patient not taking: Reported on 01/07/2015 07/09/14   Burgess Amor, PA-C  omeprazole (PRILOSEC) 20 MG capsule Take 1 capsule (20 mg total) by mouth 2 (two) times daily. 01/07/15   Geoffery Lyons, MD  traMADol (ULTRAM) 50 MG tablet Take 1 tablet (50 mg total) by mouth every 6 (six) hours as needed. 01/07/15   Geoffery Lyons, MD   BP 110/68 mmHg  Pulse 67  Temp(Src) 98.1 F (36.7 C) (Oral)  Resp 20  Ht  (1.626 m)  Wt 125 lb (56.7 kg)  BMI 21.45 kg/m2  SpO2 100% Physical Exam  Constitutional: She is oriented to person, place, and time. She appears well-developed and well-nourished.  Non-toxic appearance.  HENT:  Head: Normocephalic.  Right Ear: Tympanic  membrane and external ear normal.  Left Ear: Tympanic membrane and external ear normal.  Mouth/Throat: Uvula is midline. Dental abscesses and dental caries present.  Airway patent. No swelling under the tongue. Pain and swelling of the left lower gum c/w abscess.  Eyes: EOM and lids are normal. Pupils are equal, round, and reactive to light.  Neck: Normal range of motion. Neck supple. Carotid bruit is not present.  No cervical lymphadnopathy  Cardiovascular: Normal rate, regular rhythm, normal heart sounds, intact distal pulses  and normal pulses.   Pulmonary/Chest: Breath sounds normal. No respiratory distress.  Abdominal: Soft. Bowel sounds are normal. There is no tenderness. There is no guarding.  Musculoskeletal: Normal range of motion.  Lymphadenopathy:       Head (right side): No submandibular adenopathy present.       Head (left side): No submandibular adenopathy present.    She has no cervical adenopathy.  Neurological: She is alert and oriented to person, place, and time. She has normal strength. No cranial nerve deficit or sensory deficit.  Skin: Skin is warm and dry.  Psychiatric: She has a normal mood and affect. Her speech is normal.  Nursing note and vitals reviewed.   ED Course  Procedures (including critical care time) Labs Review Labs Reviewed - No data to display  Imaging Review No results found.   EKG Interpretation None      MDM Patient states she has been on Amoxil for the past 3 days. She feels that the swelling is getting somewhat worse. There's been no fever. There is no trismus. There is no drooling. The airway is patent. And there is no signs of Ludwig's angina.   The patient is strongly encouraged to see a dentist as sone as possible. Rocephin 1 g was given intramuscularly to assist with possible infection.     Final diagnoses:  None    **I have reviewed nursing notes, vital signs, and all appropriate lab and imaging results for this patient.*    Ivery Quale, PA-C 04/17/15 2107  Layla Maw Ward, DO 04/17/15 2306  Ivery Quale, PA-C 05/02/15 [id number]  Layla Maw Ward, DO 05/03/15 0947

## 2015-04-17 NOTE — Discharge Instructions (Signed)
Please finish your Amoxil. You were treated tonight with intramuscular Rocephin. It is very important that she see a dentist as sone as possible concerning this infection. Dental Abscess A dental abscess is a collection of infected fluid (pus) from a bacterial infection in the inner part of the tooth (pulp). It usually occurs at the end of the tooth's root.  CAUSES   Severe tooth decay.  Trauma to the tooth that allows bacteria to enter into the pulp, such as a broken or chipped tooth. SYMPTOMS   Severe pain in and around the infected tooth.  Swelling and redness around the abscessed tooth or in the mouth or face.  Tenderness.  Pus drainage.  Bad breath.  Bitter taste in the mouth.  Difficulty swallowing.  Difficulty opening the mouth.  Nausea.  Vomiting.  Chills.  Swollen neck glands. DIAGNOSIS   A medical and dental history will be taken.  An examination will be performed by tapping on the abscessed tooth.  X-rays may be taken of the tooth to identify the abscess. TREATMENT The goal of treatment is to eliminate the infection. You may be prescribed antibiotic medicine to stop the infection from spreading. A root canal may be performed to save the tooth. If the tooth cannot be saved, it may be pulled (extracted) and the abscess may be drained.  HOME CARE INSTRUCTIONS  Only take over-the-counter or prescription medicines for pain, fever, or discomfort as directed by your caregiver.  Rinse your mouth (gargle) often with salt water ( tsp salt in 8 oz [250 ml] of warm water) to relieve pain or swelling.  Do not drive after taking pain medicine (narcotics).  Do not apply heat to the outside of your face.  Return to your dentist for further treatment as directed. SEEK MEDICAL CARE IF:  Your pain is not helped by medicine.  Your pain is getting worse instead of better. SEEK IMMEDIATE MEDICAL CARE IF:  You have a fever or persistent symptoms for more than 2-3  days.  You have a fever and your symptoms suddenly get worse.  You have chills or a very bad headache.  You have problems breathing or swallowing.  You have trouble opening your mouth.  You have swelling in the neck or around the eye. Document Released: 10/21/2005 Document Revised: 07/15/2012 Document Reviewed: 01/29/2011 [facility] Patient Information 2015 [place], [place]. This information is not intended to replace advice given to you by your health care provider. Make sure you discuss any questions you have with your health care provider.

## 2015-04-17 NOTE — ED Notes (Signed)
Pt c/o abscessed tooth to left side of mouth, pt was seen here x 4 days ago and given antibiotics; pt states the pain and swelling is getting worse

## 2015-04-25 ENCOUNTER — Encounter: Payer: Self-pay | Admitting: Women's Health

## 2015-04-25 ENCOUNTER — Other Ambulatory Visit (HOSPITAL_COMMUNITY)
Admission: RE | Admit: 2015-04-25 | Discharge: 2015-04-25 | Disposition: A | Discharge status: Home / Self Care | Payer: Medicaid Other | Source: Ambulatory Visit | Attending: Obstetrics & Gynecology | Admitting: Obstetrics & Gynecology

## 2015-04-25 ENCOUNTER — Ambulatory Visit (INDEPENDENT_AMBULATORY_CARE_PROVIDER_SITE_OTHER): Payer: Medicaid Other | Admitting: Women's Health

## 2015-04-25 VITALS — BP 104/60 | HR 76 | Ht 63.75 in | Wt 126.5 lb

## 2015-04-25 DIAGNOSIS — Z01419 Encounter for gynecological examination (general) (routine) without abnormal findings: Secondary | ICD-10-CM | POA: Diagnosis not present

## 2015-04-25 DIAGNOSIS — Z30431 Encounter for routine checking of intrauterine contraceptive device: Secondary | ICD-10-CM

## 2015-04-25 DIAGNOSIS — F17200 Nicotine dependence, unspecified, uncomplicated: Secondary | ICD-10-CM | POA: Insufficient documentation

## 2015-04-25 DIAGNOSIS — K644 Residual hemorrhoidal skin tags: Secondary | ICD-10-CM

## 2015-04-25 DIAGNOSIS — N898 Other specified noninflammatory disorders of vagina: Secondary | ICD-10-CM

## 2015-04-25 DIAGNOSIS — Z309 Encounter for contraceptive management, unspecified: Secondary | ICD-10-CM | POA: Diagnosis not present

## 2015-04-25 DIAGNOSIS — Z113 Encounter for screening for infections with a predominantly sexual mode of transmission: Secondary | ICD-10-CM | POA: Insufficient documentation

## 2015-04-25 LAB — POCT WET PREP (WET MOUNT): Clue Cells Wet Prep Whiff POC: NEGATIVE

## 2015-04-25 NOTE — Progress Notes (Signed)
Patient ID: Victoria Soto, female   DOB: 1986-01-06, 29 y.o.   MRN: [id number] Subjective:   Victoria Soto is a 29 y.o. G23P2012 Caucasian female here for a routine FP [facility] well-woman exam.  No LMP recorded. Patient is not currently having periods (Reason: IUD).    Current complaints: hemorrhoids- doesn't really bother her. Vaginal odor x 3 years since birth of son. Wants Mirena IUD out, was placed in 2013, partner is incarcerated and won't be out for another year, then they still want to wait a little while before trying for another baby. Doesn't report any problems w/ IUD, just wants it out.   Social History: Sexual: heterosexual Marital Status: engaged Living situation: alone Occupation: Engineer, structural, Conservation officer, [employer] Tobacco/alcohol: smokes 3-4 cigs/day, etoh: none Illicit drugs: no history of illicit drug use  The following portions of the patient's history were reviewed and updated as appropriate: allergies, current medications, past family history, past medical history, past social history, past surgical history and problem list.  Past Medical History Past Medical History  Diagnosis Date  . No pertinent past medical history   . [id number](976.7)     Past Surgical History Past Surgical History  Procedure Laterality Date  . No past surgeries      Gynecologic History [id number]  No LMP recorded. Patient is not currently having periods (Reason: IUD). Contraception: IUD Last Pap: unsure. Results were: unsure Last mammogram: never. Results were: n/a Last TCS: never  Obstetric History OB History  Gravida Para Term Preterm AB SAB TAB Ectopic Multiple Living  3 2 2  0 1 1 0 0 0 2    # Outcome Date GA Lbr Len/2nd Weight Sex Delivery Anes PTL Lv  3 Term 05/19/12 [redacted]w[redacted]d 11:48 / 00:17 7 lb 12.2 oz (3.52 kg) M Vag-Spont EPI  Y     Comments: Normal newborn  2 Term 2009 [redacted]w[redacted]d  7 lb 14 oz (3.572 kg) F Vag-Spont   Y     Comments: late prenatal care  1 SAB 2007              Current  Medications Current Outpatient Prescriptions on File Prior to Visit  Medication Sig Dispense Refill  . ibuprofen (ADVIL,MOTRIN) 600 MG tablet Take 1 tablet (600 mg total) by mouth every 6 (six) hours as needed. (Patient not taking: Reported on 01/07/2015) 20 tablet 0  . omeprazole (PRILOSEC) 20 MG capsule Take 1 capsule (20 mg total) by mouth 2 (two) times daily. (Patient not taking: Reported on 04/25/2015) 30 capsule 0  . traMADol (ULTRAM) 50 MG tablet Take 1 tablet (50 mg total) by mouth every 6 (six) hours as needed. (Patient not taking: Reported on 04/25/2015) 12 tablet 0   No current facility-administered medications on file prior to visit.    Review of Systems Patient denies any headaches, blurred vision, shortness of breath, chest pain, abdominal pain, problems with bowel movements, urination, or intercourse.  Objective:  BP 104/60 mmHg  Pulse 76  Ht 5' 3.75" (1.619 m)  Wt 126 lb 8 oz (57.38 kg)  BMI 21.89 kg/m2 Physical Exam  General:  Well developed, well nourished, no acute distress. She is alert and oriented x3. Skin:  Warm and dry Neck:  Midline trachea, no thyromegaly or nodules Cardiovascular: Regular rate and rhythm, no murmur heard Lungs:  Effort normal, all lung fields clear to auscultation bilaterally Breasts:  No dominant palpable mass, retraction, or nipple discharge Abdomen:  Soft, non tender, no hepatosplenomegaly or masses Pelvic:  External genitalia is  normal in appearance.  The vagina is normal in appearance. Creamy white nonodorous d/c. The cervix is bulbous, no CMT, very small amt of IUD string visible.  Thin prep pap is done w/ reflex HR HPV cotesting. Uterus is felt to be normal size, shape, and contour.  No adnexal masses or tenderness noted. Rectal: small external non-thrombosed hemorrhoid Extremities:  No swelling or varicosities noted Psych:  She has a normal mood and affect  Results for orders placed or performed in visit on 04/25/15 (from the past 24  hour(s))  POCT Wet Prep [name])     Status: Abnormal   Collection Time: 04/25/15  9:27 AM  Result Value Ref Range   Source Wet Prep POC vaginal    WBC, Wet Prep HPF POC many    Bacteria Wet Prep HPF POC None (A) Few   BACTERIA WET PREP MORPHOLOGY POC     Clue Cells Wet Prep HPF POC None None   Clue Cells Wet Prep Whiff POC Negative Whiff    Yeast Wet Prep HPF POC None    KOH Wet Prep POC     Trichomonas Wet Prep HPF POC none     Assessment:   FP [facility] Healthy well-woman exam STD screening Wants IUD out Hemorrhoid Smoker  Plan:  GC/CT from pap, HIV, RPR, HSV2 per FP [facility] F/U 1wk for IUD removal, or sooner if needed Advised smoking cessation- interested, thinks she can quit last 3-4 on her own. To call 1-800-quit-now if unable and needs assistance If gc/ct normal, can try luvena or rephresh for vaginal odor OTC hemorrhoid creams Mammogram  or sooner if problems Colonoscopy  or sooner if problems  Marge Duncans CNM, [facility] 04/25/2015 9:16 AM

## 2015-04-25 NOTE — Patient Instructions (Signed)
Victoria Soto, Rephresh  1-800-quit-now for smoking

## 2015-04-26 LAB — HIV ANTIBODY (ROUTINE TESTING W REFLEX): HIV Screen 4th Generation wRfx: NONREACTIVE

## 2015-04-26 LAB — CYTOLOGY - PAP

## 2015-04-26 LAB — HSV 2 ANTIBODY, IGG: HSV 2 Glycoprotein G Ab, IgG: <0.91 index (ref 0.00–0.90)

## 2015-04-26 LAB — RPR: RPR Ser Ql: NONREACTIVE

## 2015-05-01 ENCOUNTER — Ambulatory Visit: Payer: Medicaid Other | Admitting: Women's Health

## 2015-05-09 ENCOUNTER — Ambulatory Visit: Payer: Medicaid Other | Admitting: Women's Health

## 2015-05-16 ENCOUNTER — Ambulatory Visit (INDEPENDENT_AMBULATORY_CARE_PROVIDER_SITE_OTHER): Payer: Medicaid Other | Admitting: Advanced Practice Midwife

## 2015-05-16 ENCOUNTER — Encounter: Payer: Self-pay | Admitting: Advanced Practice Midwife

## 2015-05-16 VITALS — BP 100/68 | Ht 64.0 in | Wt 126.0 lb

## 2015-05-16 DIAGNOSIS — Z30432 Encounter for removal of intrauterine contraceptive device: Secondary | ICD-10-CM

## 2015-05-16 NOTE — Progress Notes (Signed)
  Victoria Soto 29 y.o.  Filed Vitals:   05/16/15 1112  BP: 100/68   Past Medical History  Diagnosis Date  . No pertinent past medical history   . OILNZVJK(820.6)    Past Surgical History  Procedure Laterality Date  . No past surgeries     family history includes Diabetes in her father; Hypertension in her mother; Muscular dystrophy in her other. There is no history of Anesthesia problems. No current outpatient prescriptions on file.    Here for IUD removal.  She had the Mirena IUD placed 2013 and would like it removed because boyfriend is in jail for another year. Her plans for future contraception are nothing, as she plans to get pregnant when he gets out. .  A graves speculum was placed, and the strings were visible.  They were grasped with a curved Tresa Endo and the IUD easily removed.  Pt given IUD removal f/u instructions.

## 2015-06-24 ENCOUNTER — Encounter (HOSPITAL_COMMUNITY): Payer: Self-pay | Admitting: *Deleted

## 2015-06-24 ENCOUNTER — Emergency Department (HOSPITAL_COMMUNITY)
Admission: EM | Admit: 2015-06-24 | Discharge: 2015-06-24 | Disposition: A | Discharge status: Home / Self Care | Payer: Medicaid Other | Attending: Emergency Medicine | Admitting: Emergency Medicine

## 2015-06-24 DIAGNOSIS — K029 Dental caries, unspecified: Secondary | ICD-10-CM | POA: Insufficient documentation

## 2015-06-24 DIAGNOSIS — Z720 Tobacco use: Secondary | ICD-10-CM | POA: Insufficient documentation

## 2015-06-24 DIAGNOSIS — K047 Periapical abscess without sinus: Secondary | ICD-10-CM | POA: Insufficient documentation

## 2015-06-24 MED ORDER — ACETAMINOPHEN-CODEINE #3 300-30 MG PO TABS: 1.0000 | ORAL_TABLET | Freq: Four times a day (QID) | ORAL | Status: DC | PRN

## 2015-06-24 MED ORDER — AMOXICILLIN 250 MG PO CAPS
500.0000 mg | ORAL_CAPSULE | Freq: Once | ORAL | Status: AC
  Administered 2015-06-24: 500 mg via ORAL
  Filled 2015-06-24: qty 2

## 2015-06-24 MED ORDER — IBUPROFEN 800 MG PO TABS
800.0000 mg | ORAL_TABLET | Freq: Once | ORAL | Status: AC
  Administered 2015-06-24: 800 mg via ORAL
  Filled 2015-06-24: qty 1

## 2015-06-24 MED ORDER — AMOXICILLIN 500 MG PO CAPS: 500.0000 mg | ORAL_CAPSULE | Freq: Three times a day (TID) | ORAL | Status: AC

## 2015-06-24 NOTE — ED Provider Notes (Signed)
CSN: [id number]     Arrival date & time 06/24/15  1947 History   First MD Initiated Contact with Patient 06/24/15 2020     Chief Complaint  Patient presents with  . Dental Pain     (Consider location/radiation/quality/duration/timing/severity/associated sxs/prior Treatment) The history is provided by the patient.   Victoria Soto is a 29 y.o. female presenting with a 2 day history of  gingival swelling and an abscess that burst today along her left lower gumline.   The patient has a history ofdecay in the tooth involved and she reports it has abscessed previously as well, but improves with antibiotics.  Her pain is improved since the swelling has reduced with the drainage.  There has been no fevers, chills, nausea or vomiting, also no complaint of difficulty swallowing, although chewing makes pain worse.  The patient has taken no medicines prior to arrival.  She has been unable to see a dentist due to lack of dental insurance.    Past Medical History  Diagnosis Date  . No pertinent past medical history   . IDHWYSHU(837.2)    Past Surgical History  Procedure Laterality Date  . No past surgeries     Family History  Problem Relation Age of Onset  . Anesthesia problems Neg Hx   . Muscular dystrophy Other   . Hypertension Mother   . Diabetes Father    Social History  Substance Use Topics  . Smoking status: Current Every Day Smoker -- 0.20 packs/day for 10 years    Types: Cigarettes  . Smokeless tobacco: Never Used  . Alcohol Use: No   OB History    Gravida Para Term Preterm AB TAB SAB Ectopic Multiple Living   3 2 2  0 1 0 1 0 0 2     Review of Systems  Constitutional: Negative for fever.  HENT: Positive for dental problem. Negative for facial swelling and sore throat.   Respiratory: Negative for shortness of breath.   Musculoskeletal: Negative for neck pain and neck stiffness.      Allergies  Review of patient's allergies indicates no known allergies.  Home  Medications   Prior to Admission medications   Medication Sig Start Date End Date Taking? Authorizing Provider  acetaminophen-codeine (TYLENOL #3) 300-30 MG per tablet Take 1-2 tablets by mouth every 6 (six) hours as needed for moderate pain. 06/24/15   Burgess Amor, PA-C  amoxicillin (AMOXIL) 500 MG capsule Take 1 capsule (500 mg total) by mouth 3 (three) times daily. 06/24/15 07/04/15  Burgess Amor, PA-C   BP 110/72 mmHg  Pulse 99  Temp(Src) 98.6 F (37 C) (Oral)  Resp 18  Ht 5\' 4"  (1.626 m)  Wt 122 lb (55.339 kg)  BMI 20.93 kg/m2  SpO2 100%  LMP 06/06/2015 Physical Exam  Constitutional: She is oriented to person, place, and time. She appears well-developed and well-nourished. No distress.  HENT:  Head: Normocephalic and atraumatic.  Right Ear: Tympanic membrane and external ear normal.  Left Ear: Tympanic membrane and external ear normal.  Mouth/Throat: Oropharynx is clear and moist and mucous membranes are normal. No oral lesions. No trismus in the jaw. Dental abscesses present.    Small residual abscess site left lower gingiva, no persistent active drainage.  Sublingual space is soft. Decay noted at left lower second premolar.  Eyes: Conjunctivae are normal.  Neck: Normal range of motion. Neck supple.  Cardiovascular: Normal rate and normal heart sounds.   Pulmonary/Chest: Effort normal.  Abdominal: She exhibits no  distension.  Musculoskeletal: Normal range of motion.  Lymphadenopathy:    She has no cervical adenopathy.  Neurological: She is alert and oriented to person, place, and time.  Skin: Skin is warm and dry. No erythema.  Psychiatric: She has a normal mood and affect.    ED Course  Procedures (including critical care time) Labs Review Labs Reviewed - No data to display  Imaging Review No results found. I have personally reviewed and evaluated these images and lab results as part of my medical decision-making.   EKG Interpretation None      MDM   Final  diagnoses:  Dental abscess    Pt placed on amoxil, tylenol #3 .  Dental referrals given.  Pt without trismus, no persistent drainable abscess.    Burgess Amor, PA-C 06/26/15 1305  Benjiman Core, MD 06/26/15 2035

## 2015-06-24 NOTE — Discharge Instructions (Signed)

## 2015-06-24 NOTE — ED Notes (Signed)
Pt states she has an abscess that has burst on the left lower jaw.

## 2015-10-05 ENCOUNTER — Emergency Department (HOSPITAL_COMMUNITY): Payer: Medicaid Other

## 2015-10-05 ENCOUNTER — Emergency Department (HOSPITAL_COMMUNITY)
Admission: EM | Admit: 2015-10-05 | Discharge: 2015-10-05 | Disposition: A | Discharge status: Home / Self Care | Payer: Medicaid Other | Attending: Emergency Medicine | Admitting: Emergency Medicine

## 2015-10-05 ENCOUNTER — Encounter (HOSPITAL_COMMUNITY): Payer: Self-pay | Admitting: Cardiology

## 2015-10-05 DIAGNOSIS — S61216A Laceration without foreign body of right little finger without damage to nail, initial encounter: Secondary | ICD-10-CM | POA: Insufficient documentation

## 2015-10-05 DIAGNOSIS — S61219A Laceration without foreign body of unspecified finger without damage to nail, initial encounter: Secondary | ICD-10-CM

## 2015-10-05 DIAGNOSIS — Y93G1 Activity, food preparation and clean up: Secondary | ICD-10-CM | POA: Insufficient documentation

## 2015-10-05 DIAGNOSIS — F17210 Nicotine dependence, cigarettes, uncomplicated: Secondary | ICD-10-CM | POA: Insufficient documentation

## 2015-10-05 DIAGNOSIS — W25XXXA Contact with sharp glass, initial encounter: Secondary | ICD-10-CM | POA: Insufficient documentation

## 2015-10-05 DIAGNOSIS — Y998 Other external cause status: Secondary | ICD-10-CM | POA: Insufficient documentation

## 2015-10-05 DIAGNOSIS — Y9289 Other specified places as the place of occurrence of the external cause: Secondary | ICD-10-CM | POA: Insufficient documentation

## 2015-10-05 MED ORDER — LIDOCAINE HCL (PF) 1 % IJ SOLN
INTRAMUSCULAR | Status: AC
  Administered 2015-10-05: 5 mL
  Filled 2015-10-05: qty 5

## 2015-10-05 NOTE — Discharge Instructions (Signed)
Laceration Care, Adult  A laceration is a cut that goes through all layers of the skin. The cut also goes into the tissue that is right under the skin. Some cuts heal on their own. Others need to be closed with stitches (sutures), staples, skin adhesive strips, or wound glue. Taking care of your cut lowers your risk of infection and helps your cut to heal better.  HOW TO TAKE CARE OF YOUR CUT  For stitches or staples:  · Keep the wound clean and dry.  · If you were given a bandage (dressing), you should change it at least one time per day or as told by your doctor. You should also change it if it gets wet or dirty.  · Keep the wound completely dry for the first 24 hours or as told by your doctor. After that time, you may take a shower or a bath. However, make sure that the wound is not soaked in water until after the stitches or staples have been removed.  · Clean the wound one time each day or as told by your doctor:    Wash the wound with soap and water.    Rinse the wound with water until all of the soap comes off.    Pat the wound dry with a clean towel. Do not rub the wound.  · After you clean the wound, put a thin layer of antibiotic ointment on it as told by your doctor. This ointment:    Helps to prevent infection.    Keeps the bandage from sticking to the wound.  · Have your stitches or staples removed as told by your doctor.  If your doctor used skin adhesive strips:   · Keep the wound clean and dry.  · If you were given a bandage, you should change it at least one time per day or as told by your doctor. You should also change it if it gets dirty or wet.  · Do not get the skin adhesive strips wet. You can take a shower or a bath, but be careful to keep the wound dry.  · If the wound gets wet, pat it dry with a clean towel. Do not rub the wound.  · Skin adhesive strips fall off on their own. You can trim the strips as the wound heals. Do not remove any strips that are still stuck to the wound. They will  fall off after a while.  If your doctor used wound glue:  · Try to keep your wound dry, but you may briefly wet it in the shower or bath. Do not soak the wound in water, such as by swimming.  · After you take a shower or a bath, gently pat the wound dry with a clean towel. Do not rub the wound.  · Do not do any activities that will make you really sweaty until the skin glue has fallen off on its own.  · Do not apply liquid, cream, or ointment medicine to your wound while the skin glue is still on.  · If you were given a bandage, you should change it at least one time per day or as told by your doctor. You should also change it if it gets dirty or wet.  · If a bandage is placed over the wound, do not let the tape for the bandage touch the skin glue.  · Do not pick at the glue. The skin glue usually stays on for 5-10 days. Then, it   falls off of the skin.  General Instructions   · To help prevent scarring, make sure to cover your wound with sunscreen whenever you are outside after stitches are removed, after adhesive strips are removed, or when wound glue stays in place and the wound is healed. Make sure to wear a sunscreen of at least 30 SPF.  · Take over-the-counter and prescription medicines only as told by your doctor.  · If you were given antibiotic medicine or ointment, take or apply it as told by your doctor. Do not stop using the antibiotic even if your wound is getting better.  · Do not scratch or pick at the wound.  · Keep all follow-up visits as told by your doctor. This is important.  · Check your wound every day for signs of infection. Watch for:    Redness, swelling, or pain.    Fluid, blood, or pus.  · Raise (elevate) the injured area above the level of your heart while you are sitting or lying down, if possible.  GET HELP IF:  · You got a tetanus shot and you have any of these problems at the injection site:    Swelling.    Very bad pain.    Redness.    Bleeding.  · You have a fever.  · A wound that was  closed breaks open.  · You notice a bad smell coming from your wound or your bandage.  · You notice something coming out of the wound, such as wood or glass.  · Medicine does not help your pain.  · You have more redness, swelling, or pain at the site of your wound.  · You have fluid, blood, or pus coming from your wound.  · You notice a change in the color of your skin near your wound.  · You need to change the bandage often because fluid, blood, or pus is coming from the wound.  · You start to have a new rash.  · You start to have numbness around the wound.  GET HELP RIGHT AWAY IF:  · You have very bad swelling around the wound.  · Your pain suddenly gets worse and is very bad.  · You notice painful lumps near the wound or on skin that is anywhere on your body.  · You have a red streak going away from your wound.  · The wound is on your hand or foot and you cannot move a finger or toe like you usually can.  · The wound is on your hand or foot and you notice that your fingers or toes look pale or bluish.     This information is not intended to replace advice given to you by your health care provider. Make sure you discuss any questions you have with your health care provider.     Document Released: 04/08/2008 Document Revised: 03/07/2015 Document Reviewed: 10/17/2014  Elsevier Interactive Patient Education ©2016 [employer]

## 2015-10-05 NOTE — ED Notes (Signed)
Finger splint with dry dressing and ace bandage applied to hand.

## 2015-10-05 NOTE — ED Notes (Signed)
Bleeding controlled laceration to right hand pinky medial finger obtained by a piece of glass while cleaning a table this evening.

## 2015-10-05 NOTE — ED Provider Notes (Signed)
CSN: [id number]     Arrival date & time 10/05/15  1644 History   First MD Initiated Contact with Patient 10/05/15 1653     Chief Complaint  Patient presents with  . Laceration     (Consider location/radiation/quality/duration/timing/severity/associated sxs/prior Treatment) HPI   Victoria Soto is a 29 y.o. female who presents to the Emergency Department complaining of laceration to her right fifth finger that occurred just prior to arrival.  She states that she was cleaning a glass coffee table that already had "broken glass" on it.  She states that she cut her finger as she was cleaning.  She states that she controlled the bleeding after several minutes of pressure.  She describes a "tingling" sensation to her distal finger, but denies difficulty moving it and numbness.  Last Td is up to date.  She also denies FB's, pain to her wrist or hand, swelling or ASA use.    Past Medical History  Diagnosis Date  . No pertinent past medical history   . ZESPQZRA(076.2)    Past Surgical History  Procedure Laterality Date  . No past surgeries     Family History  Problem Relation Age of Onset  . Anesthesia problems Neg Hx   . Muscular dystrophy Other   . Hypertension Mother   . Diabetes Father    Social History  Substance Use Topics  . Smoking status: Current Every Day Smoker -- 0.20 packs/day for 10 years    Types: Cigarettes  . Smokeless tobacco: Never Used  . Alcohol Use: No   OB History    Gravida Para Term Preterm AB TAB SAB Ectopic Multiple Living   3 2 2  0 1 0 1 0 0 2     Review of Systems  Constitutional: Negative for fever and chills.  Musculoskeletal: Negative for back pain, joint swelling and arthralgias.  Skin: Positive for wound.       Laceration   Neurological: Negative for dizziness, weakness and numbness.  Hematological: Does not bruise/bleed easily.  All other systems reviewed and are negative.     Allergies  Review of patient's allergies indicates no  known allergies.  Home Medications   Prior to Admission medications   Medication Sig Start Date End Date Taking? Authorizing Provider  acetaminophen-codeine (TYLENOL #3) 300-30 MG per tablet Take 1-2 tablets by mouth every 6 (six) hours as needed for moderate pain. 06/24/15   Burgess Amor, PA-C   BP 117/75 mmHg  Pulse 83  Temp(Src) 97.7 F (36.5 C) (Oral)  Resp 15  Ht 5\' 4"  (1.626 m)  Wt 54.432 kg  BMI 20.59 kg/m2  SpO2 100%  LMP 09/05/2015 Physical Exam  Constitutional: She is oriented to person, place, and time. She appears well-developed and well-nourished. No distress.  HENT:  Head: Normocephalic and atraumatic.  Cardiovascular: Normal rate, regular rhythm and intact distal pulses.   No murmur heard. Pulmonary/Chest: Effort normal and breath sounds normal. No respiratory distress.  Musculoskeletal: She exhibits no edema or tenderness.       Right shoulder: She exhibits normal range of motion, no bony tenderness, no swelling, no deformity, normal pulse and normal strength.       Right hand: She exhibits laceration. She exhibits no bony tenderness, normal two-point discrimination, normal capillary refill and no swelling. Normal sensation noted. Normal strength noted.       Hands: 2 cm laceration to the radial aspect of the proximal right fifth finger.  No FB's, bleeding controlled.  Pt has full  ROM of the finger.  No edema.    Neurological: She is alert and oriented to person, place, and time. She exhibits normal muscle tone. Coordination normal.  Skin: Skin is warm.  Psychiatric: She has a normal mood and affect.  Nursing note and vitals reviewed.   ED Course  Procedures (including critical care time) Imaging Review Dg Hand Complete Right  10/05/2015  CLINICAL DATA:  Laceration of the medial side of the right pinky. EXAM: RIGHT HAND - COMPLETE 3+ VIEW COMPARISON:  None. FINDINGS: There is no evidence of fracture or dislocation. There is no evidence of arthropathy or other  focal bone abnormality. Soft tissues are unremarkable. IMPRESSION: Negative. Electronically Signed   By: Elige Ko   On: 10/05/2015 18:15   I have personally reviewed and evaluated these images and lab results as part of my medical decision-making.  LACERATION REPAIR Performed by: Nazair Fortenberry L. Authorized by: Maxwell Caul Consent: Verbal consent obtained. Risks and benefits: risks, benefits and alternatives were discussed Consent given by: patient Patient identity confirmed: provided demographic data Prepped and Draped in normal sterile fashion Wound explored  Laceration Location: right fifth finger  Laceration Length: 2 cm  No Foreign Bodies seen or palpated  Anesthesia: local infiltration  Local anesthetic: lidocaine 1 % w/o epinephrine  Anesthetic total: 2 ml  Irrigation method: syringe Amount of cleaning: standard  Skin closure: 4-0 ethilon  Number of sutures: 4  Technique: simple interrupted  Patient tolerance: Patient tolerated the procedure well with no immediate complications.   MDM   Final diagnoses:  Finger laceration, initial encounter    Td up to date.  NV and NS intact. XR neg.  Pt agrees to wound care instructions, suture removal in 10 days and return for any signs of infection.  Dressing and finger splint applied.     Pauline Aus, PA-C 10/06/15 1710  Vanetta Mulders, MD 10/11/15 1550

## 2015-10-05 NOTE — ED Notes (Signed)
Was cleaning a coffee table with a broken glass.  Laceration  Right pinky

## 2015-10-14 ENCOUNTER — Emergency Department (HOSPITAL_COMMUNITY)
Admission: EM | Admit: 2015-10-14 | Discharge: 2015-10-14 | Disposition: A | Discharge status: Home / Self Care | Payer: Medicaid Other | Attending: Emergency Medicine | Admitting: Emergency Medicine

## 2015-10-14 ENCOUNTER — Encounter (HOSPITAL_COMMUNITY): Payer: Self-pay | Admitting: Emergency Medicine

## 2015-10-14 DIAGNOSIS — F17210 Nicotine dependence, cigarettes, uncomplicated: Secondary | ICD-10-CM | POA: Insufficient documentation

## 2015-10-14 DIAGNOSIS — Z4802 Encounter for removal of sutures: Secondary | ICD-10-CM | POA: Insufficient documentation

## 2015-10-14 NOTE — ED Notes (Signed)
Sutures removed by PA Tripplett  -- 4 sutures removed-

## 2015-10-14 NOTE — Discharge Instructions (Signed)

## 2015-10-14 NOTE — ED Notes (Signed)
PT states sutures to right hand pinky finger x10 days and here for suture removal.

## 2015-10-14 NOTE — ED Notes (Signed)
Discharge papers given to pt by B Patraw RN

## 2015-10-14 NOTE — ED Provider Notes (Signed)
CSN: [id number]     Arrival date & time 10/14/15  1238 History   First MD Initiated Contact with Patient 10/14/15 1302     Chief Complaint  Patient presents with  . Suture / Staple Removal     (Consider location/radiation/quality/duration/timing/severity/associated sxs/prior Treatment) HPI   Victoria Soto is a 29 y.o. female who presents to the Emergency Department requesting suture removal from the right fifth finger.  Pt was seen here 10 days prior after a laceration to the finger.  She currently denies any pain, redness, swelling or drainage.  She states it has been healing well.     Past Medical History  Diagnosis Date  . No pertinent past medical history   . UPBDHDIX(784.7)    Past Surgical History  Procedure Laterality Date  . No past surgeries     Family History  Problem Relation Age of Onset  . Anesthesia problems Neg Hx   . Muscular dystrophy Other   . Hypertension Mother   . Diabetes Father    Social History  Substance Use Topics  . Smoking status: Current Every Day Smoker -- 0.20 packs/day for 10 years    Types: Cigarettes  . Smokeless tobacco: Never Used  . Alcohol Use: No   OB History    Gravida Para Term Preterm AB TAB SAB Ectopic Multiple Living   3 2 2  0 1 0 1 0 0 2     Review of Systems  Musculoskeletal: Negative for myalgias and arthralgias.  Skin:       Laceration to right fifth finger with sutures in place  Neurological: Negative for weakness and numbness.  All other systems reviewed and are negative.     Allergies  Review of patient's allergies indicates no known allergies.  Home Medications   Prior to Admission medications   Medication Sig Start Date End Date Taking? Authorizing Provider  acetaminophen-codeine (TYLENOL #3) 300-30 MG per tablet Take 1-2 tablets by mouth every 6 (six) hours as needed for moderate pain. 06/24/15   Burgess Amor, PA-C   BP 106/70 mmHg  Pulse 71  Temp(Src) 98.7 F (37.1 C) (Oral)  Resp 16  SpO2 100%   LMP 09/05/2015 Physical Exam  Constitutional: She is oriented to person, place, and time. She appears well-developed and well-nourished. No distress.  HENT:  Head: Normocephalic and atraumatic.  Pulmonary/Chest: Effort normal.  Musculoskeletal: She exhibits no edema or tenderness.  Well healing lac to the right medial fifth finger.  Pt has full ROM of the finger, sensation intact, no erythema or edema.  Neurological: She is alert and oriented to person, place, and time.  Skin: Skin is warm and dry.  Nursing note and vitals reviewed.   ED Course  Procedures (including critical care time) Labs Review Labs Reviewed - No data to display  Imaging Review No results found. I have personally reviewed and evaluated these images and lab results as part of my medical decision-making.  Procedure:  Sutures removed by me w/o difficulty.  Suture line remains intact.  Appears to be healing well.    MDM   Final diagnoses:  Visit for suture removal    Pt remains NV and NS intact.  Pt has full ROm of the finger.  Appears to be healing well.      Pauline Aus, PA-C 10/18/15 1321  Vanetta Mulders, MD 10/19/15 [phone]

## 2015-12-29 ENCOUNTER — Emergency Department (HOSPITAL_COMMUNITY)
Admission: EM | Admit: 2015-12-29 | Discharge: 2015-12-29 | Disposition: A | Discharge status: Home / Self Care | Payer: Medicaid Other | Attending: Emergency Medicine | Admitting: Emergency Medicine

## 2015-12-29 ENCOUNTER — Encounter (HOSPITAL_COMMUNITY): Payer: Self-pay | Admitting: Emergency Medicine

## 2015-12-29 DIAGNOSIS — R1084 Generalized abdominal pain: Secondary | ICD-10-CM

## 2015-12-29 DIAGNOSIS — F17210 Nicotine dependence, cigarettes, uncomplicated: Secondary | ICD-10-CM | POA: Diagnosis not present

## 2015-12-29 DIAGNOSIS — Z3202 Encounter for pregnancy test, result negative: Secondary | ICD-10-CM | POA: Insufficient documentation

## 2015-12-29 LAB — CBC WITH DIFFERENTIAL/PLATELET
BASOS ABS: 0 10*3/uL (ref 0.0–0.1)
Basophils Relative: 0 %
EOS ABS: 0 10*3/uL (ref 0.0–0.7)
EOS PCT: 0 %
HCT: 43.5 % (ref 36.0–46.0)
HEMOGLOBIN: 14.6 g/dL (ref 12.0–15.0)
LYMPHS PCT: 4 %
Lymphs Abs: 0.4 10*3/uL — ABNORMAL LOW (ref 0.7–4.0)
MCH: 30.2 pg (ref 26.0–34.0)
MCHC: 33.6 g/dL (ref 30.0–36.0)
MCV: 89.9 fL (ref 78.0–100.0)
Monocytes Absolute: 0.3 10*3/uL (ref 0.1–1.0)
Monocytes Relative: 3 %
Neutro Abs: 8.8 10*3/uL — ABNORMAL HIGH (ref 1.7–7.7)
Neutrophils Relative %: 93 %
Platelets: 208 10*3/uL (ref 150–400)
RBC: 4.84 MIL/uL (ref 3.87–5.11)
RDW: 12.3 % (ref 11.5–15.5)
WBC: 9.6 10*3/uL (ref 4.0–10.5)

## 2015-12-29 LAB — COMPREHENSIVE METABOLIC PANEL
ALBUMIN: 4.6 g/dL (ref 3.5–5.0)
ALK PHOS: 58 U/L (ref 38–126)
ALT: 9 U/L — ABNORMAL LOW (ref 14–54)
AST: 12 U/L — ABNORMAL LOW (ref 15–41)
Anion gap: 7 (ref 5–15)
BUN: 18 mg/dL (ref 6–20)
CALCIUM: 9.1 mg/dL (ref 8.9–10.3)
CO2: 27 mmol/L (ref 22–32)
Chloride: 106 mmol/L (ref 101–111)
Creatinine, Ser: 0.65 mg/dL (ref 0.44–1.00)
GFR calc non Af Amer: >60 mL/min (ref >60)
Glucose, Bld: 107 mg/dL — ABNORMAL HIGH (ref 65–99)
Potassium: 3.8 mmol/L (ref 3.5–5.1)
Sodium: 140 mmol/L (ref 135–145)
Total Bilirubin: 0.9 mg/dL (ref 0.3–1.2)
Total Protein: 7.5 g/dL (ref 6.5–8.1)

## 2015-12-29 LAB — LIPASE, BLOOD: LIPASE: 18 U/L (ref 11–51)

## 2015-12-29 LAB — I-STAT BETA HCG BLOOD, ED (MC, WL, AP ONLY)

## 2015-12-29 LAB — URINALYSIS, ROUTINE W REFLEX MICROSCOPIC
Glucose, UA: NEGATIVE mg/dL
Hgb urine dipstick: NEGATIVE
KETONES UR: 15 mg/dL — AB
LEUKOCYTES UA: NEGATIVE
NITRITE: NEGATIVE
Protein, ur: NEGATIVE mg/dL
Specific Gravity, Urine: 1.025 (ref 1.005–1.030)
pH: 6.5 (ref 5.0–8.0)

## 2015-12-29 LAB — PREGNANCY, URINE: PREG TEST UR: NEGATIVE

## 2015-12-29 LAB — ETHANOL: Alcohol, Ethyl (B): <5 mg/dL (ref <5)

## 2015-12-29 MED ORDER — DICYCLOMINE HCL 10 MG/ML IM SOLN
20.0000 mg | Freq: Once | INTRAMUSCULAR | Status: AC
  Administered 2015-12-29: 20 mg via INTRAMUSCULAR
  Filled 2015-12-29: qty 2

## 2015-12-29 MED ORDER — DICYCLOMINE HCL 20 MG PO TABS: 20.0000 mg | ORAL_TABLET | Freq: Two times a day (BID) | ORAL | Status: DC

## 2015-12-29 NOTE — ED Notes (Signed)
Patient given discharge instruction, verbalized understand. Patient ambulatory out of the department.  

## 2015-12-29 NOTE — Discharge Instructions (Signed)
As discussed, your evaluation today has been largely reassuring.  But, it is important that you monitor your condition carefully, and do not hesitate to return to the ED if you develop new, or concerning changes in your condition. ? ?Otherwise, please follow-up with your physician for appropriate ongoing care. ? ?

## 2015-12-29 NOTE — ED Provider Notes (Signed)
CSN: [id number]     Arrival date & time 12/29/15  1538 History   First MD Initiated Contact with Patient 12/29/15 1934     Chief Complaint  Patient presents with  . Abdominal Pain     (Consider location/radiation/quality/duration/timing/severity/associated sxs/prior Treatment) HPI Patient presents with concern of abdominal pain. Pain began about 5 hours ago. Since onset symptoms of been persistent, with crampy diffuse abdominal discomfort. No vomiting, no diarrhea. Patient did have 2 loose stool earlier today, prior to the onset of abdominal discomfort. No fever, chills, chest pain, dyspnea. Patient notes one family member with similar illness 2 days ago. Patient states that she is generally well, denies substantial medical problems. Since onset, she has not taken any medication for pain relief. Past Medical History  Diagnosis Date  . No pertinent past medical history   . [id number](478.2)    Past Surgical History  Procedure Laterality Date  . No past surgeries     Family History  Problem Relation Age of Onset  . Anesthesia problems Neg Hx   . Muscular dystrophy Other   . Hypertension Mother   . Diabetes Father    Social History  Substance Use Topics  . Smoking status: Current Every Day Smoker -- 0.20 packs/day for 10 years    Types: Cigarettes  . Smokeless tobacco: Never Used  . Alcohol Use: No   OB History    Gravida Para Term Preterm AB TAB SAB Ectopic Multiple Living   0 1 0 1 0 0 2     Review of Systems  Constitutional:       Per HPI, otherwise negative  HENT:       Per HPI, otherwise negative  Respiratory:       Per HPI, otherwise negative  Cardiovascular:       Per HPI, otherwise negative  Gastrointestinal: Negative for vomiting.  Endocrine:       Negative aside from HPI  Genitourinary:       Neg aside from HPI   Musculoskeletal:       Per HPI, otherwise negative  Skin: Negative.   Neurological: Negative for syncope.      Allergies   Review of patient's allergies indicates no known allergies.  Home Medications   Prior to Admission medications   Medication Sig Start Date End Date Taking? Authorizing Provider  acetaminophen-codeine (TYLENOL #3) 300-30 MG per tablet Take 1-2 tablets by mouth every 6 (six) hours as needed for moderate pain. 06/24/15   Burgess Amor, PA-C   BP 108/77 mmHg  Pulse 86  Temp(Src) 98.5 F (36.9 C) (Oral)  Resp 16  Ht  (1.626 m)  Wt 122 lb (55.339 kg)  BMI 20.93 kg/m2  SpO2 100% Physical Exam  Constitutional: She is oriented to person, place, and time. She appears well-developed and well-nourished. No distress.  HENT:  Head: Normocephalic and atraumatic.  Eyes: Conjunctivae and EOM are normal.  Cardiovascular: Normal rate and regular rhythm.   Pulmonary/Chest: Effort normal and breath sounds normal. No stridor. No respiratory distress.  Abdominal: She exhibits no distension. There is no tenderness.  No ttp throughout.  Musculoskeletal: She exhibits no edema.  Neurological: She is alert and oriented to person, place, and time. No cranial nerve deficit.  Skin: Skin is warm and dry.  Psychiatric: She has a normal mood and affect.  Nursing note and vitals reviewed.   ED Course  Procedures (including critical care time) Labs Review Labs Reviewed  URINALYSIS, ROUTINE W  REFLEX MICROSCOPIC (NOT AT [facility]) - Abnormal; Notable for the following:    Bilirubin Urine SMALL (*)    Ketones, ur 15 (*)    All other components within normal limits  COMPREHENSIVE METABOLIC PANEL - Abnormal; Notable for the following:    Glucose, Bld 107 (*)    AST 12 (*)    ALT 9 (*)    All other components within normal limits  CBC WITH DIFFERENTIAL/PLATELET - Abnormal; Notable for the following:    Neutro Abs 8.8 (*)    Lymphs Abs 0.4 (*)    All other components within normal limits  PREGNANCY, URINE  LIPASE, BLOOD  ETHANOL  I-STAT BETA HCG BLOOD, ED ([facility], [facility], [facility] ONLY)   9:10 PM Patient in no  distress. She is smiling, interacting with a colleague. She requests a work note for [employer].  MDM  Patient presents with diffuse abdominal pain, no vomiting, no diarrhea. Patient's evaluation here reassuring, with no evidence for peritonitis, bacteremia or sepsis. No acute abdomen. Patient had improvement with dicyclomine, was discharged with this medication, to follow-up with primary care.  Gerhard Munch, MD 12/29/15 2116

## 2015-12-29 NOTE — ED Notes (Signed)
Having abdominal pain  and cramping for last 2 hours.  Rates pain 8/10.  Family has been passing around stomach virus.

## 2016-03-23 ENCOUNTER — Encounter: Payer: Self-pay | Admitting: Emergency Medicine

## 2016-03-23 ENCOUNTER — Emergency Department
Admission: EM | Admit: 2016-03-23 | Discharge: 2016-03-23 | Disposition: A | Discharge status: Home / Self Care | Payer: MEDICAID | Attending: Emergency Medicine | Admitting: Emergency Medicine

## 2016-03-23 DIAGNOSIS — Z79899 Other long term (current) drug therapy: Secondary | ICD-10-CM | POA: Insufficient documentation

## 2016-03-23 DIAGNOSIS — R102 Pelvic and perineal pain: Secondary | ICD-10-CM

## 2016-03-23 DIAGNOSIS — F17210 Nicotine dependence, cigarettes, uncomplicated: Secondary | ICD-10-CM | POA: Insufficient documentation

## 2016-03-23 DIAGNOSIS — N760 Acute vaginitis: Secondary | ICD-10-CM | POA: Insufficient documentation

## 2016-03-23 DIAGNOSIS — B9689 Other specified bacterial agents as the cause of diseases classified elsewhere: Secondary | ICD-10-CM

## 2016-03-23 LAB — URINALYSIS COMPLETE WITH MICROSCOPIC (ARMC ONLY)
BACTERIA UA: NONE SEEN
Bilirubin Urine: NEGATIVE
Glucose, UA: NEGATIVE mg/dL
Hgb urine dipstick: NEGATIVE
KETONES UR: NEGATIVE mg/dL
NITRITE: NEGATIVE
PH: 5 (ref 5.0–8.0)
Protein, ur: NEGATIVE mg/dL
Specific Gravity, Urine: 1.025 (ref 1.005–1.030)

## 2016-03-23 LAB — CBC
HCT: 41.2 % (ref 35.0–47.0)
Hemoglobin: 13.8 g/dL (ref 12.0–16.0)
MCH: 29.8 pg (ref 26.0–34.0)
MCHC: 33.6 g/dL (ref 32.0–36.0)
MCV: 88.6 fL (ref 80.0–100.0)
PLATELETS: 241 10*3/uL (ref 150–440)
RBC: 4.65 MIL/uL (ref 3.80–5.20)
RDW: 12.7 % (ref 11.5–14.5)
WBC: 11 10*3/uL (ref 3.6–11.0)

## 2016-03-23 LAB — COMPREHENSIVE METABOLIC PANEL
ALT: 10 U/L — AB (ref 14–54)
AST: 13 U/L — ABNORMAL LOW (ref 15–41)
Albumin: 4.4 g/dL (ref 3.5–5.0)
Alkaline Phosphatase: 57 U/L (ref 38–126)
Anion gap: 6 (ref 5–15)
BILIRUBIN TOTAL: 0.6 mg/dL (ref 0.3–1.2)
BUN: 17 mg/dL (ref 6–20)
CHLORIDE: 105 mmol/L (ref 101–111)
CO2: 27 mmol/L (ref 22–32)
CREATININE: 0.62 mg/dL (ref 0.44–1.00)
Calcium: 9.5 mg/dL (ref 8.9–10.3)
Glucose, Bld: 94 mg/dL (ref 65–99)
Potassium: 4.2 mmol/L (ref 3.5–5.1)
Sodium: 138 mmol/L (ref 135–145)
TOTAL PROTEIN: 7.2 g/dL (ref 6.5–8.1)

## 2016-03-23 LAB — WET PREP, GENITAL
SPERM: NONE SEEN
Trich, Wet Prep: NONE SEEN
Yeast Wet Prep HPF POC: NONE SEEN

## 2016-03-23 LAB — CHLAMYDIA/NGC RT PCR (ARMC ONLY)
CHLAMYDIA TR: NOT DETECTED
N gonorrhoeae: NOT DETECTED

## 2016-03-23 LAB — LIPASE, BLOOD: LIPASE: 21 U/L (ref 11–51)

## 2016-03-23 LAB — POCT PREGNANCY, URINE: PREG TEST UR: NEGATIVE

## 2016-03-23 MED ORDER — METRONIDAZOLE 500 MG PO TABS: 500.0000 mg | ORAL_TABLET | Freq: Two times a day (BID) | ORAL | Status: DC

## 2016-03-23 MED ORDER — IBUPROFEN 800 MG PO TABS: 800.0000 mg | ORAL_TABLET | Freq: Three times a day (TID) | ORAL | Status: DC | PRN

## 2016-03-23 NOTE — Discharge Instructions (Signed)
Pelvic Pain, Female Female pelvic pain can be caused by many different things and start from a variety of places. Pelvic pain refers to pain that is located in the lower half of the abdomen and between your hips. The pain may occur over a short period of time (acute) or may be reoccurring (chronic). The cause of pelvic pain may be related to disorders affecting the female reproductive organs (gynecologic), but it may also be related to the bladder, kidney stones, an intestinal complication, or muscle or skeletal problems. Getting help right away for pelvic pain is important, especially if there has been severe, sharp, or a sudden onset of unusual pain. It is also important to get help right away because some types of pelvic pain can be life threatening.  CAUSES  Below are only some of the causes of pelvic pain. The causes of pelvic pain can be in one of several categories.   Gynecologic.  Pelvic inflammatory disease.  Sexually transmitted infection.  Ovarian cyst or a twisted ovarian ligament (ovarian torsion).  Uterine lining that grows outside the uterus (endometriosis).  Fibroids, cysts, or tumors.  Ovulation.  Pregnancy.  Pregnancy that occurs outside the uterus (ectopic pregnancy).  Miscarriage.  Labor.  Abruption of the placenta or ruptured uterus.  Infection.  Uterine infection (endometritis).  Bladder infection.  Diverticulitis.  Miscarriage related to a uterine infection (septic abortion).  Bladder.  Inflammation of the bladder (cystitis).  Kidney stone(s).  Gastrointestinal.  Constipation.  Diverticulitis.  Neurologic.  Trauma.  Feeling pelvic pain because of mental or emotional causes (psychosomatic).  Cancers of the bowel or pelvis. EVALUATION  Your caregiver will want to take a careful history of your concerns. This includes recent changes in your health, a careful gynecologic history of your periods (menses), and a sexual history. Obtaining  your family history and medical history is also important. Your caregiver may suggest a pelvic exam. A pelvic exam will help identify the location and severity of the pain. It also helps in the evaluation of which organ system may be involved. In order to identify the cause of the pelvic pain and be properly treated, your caregiver may order tests. These tests may include:   A pregnancy test.  Pelvic ultrasonography.  An X-ray exam of the abdomen.  A urinalysis or evaluation of vaginal discharge.  Blood tests. HOME CARE INSTRUCTIONS   Only take over-the-counter or prescription medicines for pain, discomfort, or fever as directed by your caregiver.   Rest as directed by your caregiver.   Eat a balanced diet.   Drink enough fluids to make your urine clear or pale yellow, or as directed.   Avoid sexual intercourse if it causes pain.   Apply warm or cold compresses to the lower abdomen depending on which one helps the pain.   Avoid stressful situations.   Keep a journal of your pelvic pain. Write down when it started, where the pain is located, and if there are things that seem to be associated with the pain, such as food or your menstrual cycle.  Follow up with your caregiver as directed.  SEEK MEDICAL CARE IF:  Your medicine does not help your pain.  You have abnormal vaginal discharge. SEEK IMMEDIATE MEDICAL CARE IF:   You have heavy bleeding from the vagina.   Your pelvic pain increases.   You feel light-headed or faint.   You have chills.   You have pain with urination or blood in your urine.   You have uncontrolled  diarrhea or vomiting.   You have a fever or persistent symptoms for more than 3 days.  You have a fever and your symptoms suddenly get worse.   You are being physically or sexually abused.   This information is not intended to replace advice given to you by your health care provider. Make sure you discuss any questions you have with  your health care provider.   Document Released: 09/17/2004 Document Revised: 07/12/2015 Document Reviewed: 02/10/2012 Elsevier Interactive Patient Education 2016 Elsevier Inc. Bacterial Vaginosis Bacterial vaginosis is a vaginal infection that occurs when the normal balance of bacteria in the vagina is disrupted. It results from an overgrowth of certain bacteria. This is the most common vaginal infection in women of childbearing age. Treatment is important to prevent complications, especially in pregnant women, as it can cause a premature delivery. CAUSES  Bacterial vaginosis is caused by an increase in harmful bacteria that are normally present in smaller amounts in the vagina. Several different kinds of bacteria can cause bacterial vaginosis. However, the reason that the condition develops is not fully understood. RISK FACTORS Certain activities or behaviors can put you at an increased risk of developing bacterial vaginosis, including:  Having a new sex partner or multiple sex partners.  Douching.  Using an intrauterine device (IUD) for contraception. Women do not get bacterial vaginosis from toilet seats, bedding, swimming pools, or contact with objects around them. SIGNS AND SYMPTOMS  Some women with bacterial vaginosis have no signs or symptoms. Common symptoms include:  Grey vaginal discharge.  A fishlike odor with discharge, especially after sexual intercourse.  Itching or burning of the vagina and vulva.  Burning or pain with urination. DIAGNOSIS  Your health care provider will take a medical history and examine the vagina for signs of bacterial vaginosis. A sample of vaginal fluid may be taken. Your health care provider will look at this sample under a microscope to check for bacteria and abnormal cells. A vaginal pH test may also be done.  TREATMENT  Bacterial vaginosis may be treated with antibiotic medicines. These may be given in the form of a pill or a vaginal cream. A  second round of antibiotics may be prescribed if the condition comes back after treatment. Because bacterial vaginosis increases your risk for sexually transmitted diseases, getting treated can help reduce your risk for chlamydia, gonorrhea, HIV, and herpes. HOME CARE INSTRUCTIONS   Only take over-the-counter or prescription medicines as directed by your health care provider.  If antibiotic medicine was prescribed, take it as directed. Make sure you finish it even if you start to feel better.  Tell all sexual partners that you have a vaginal infection. They should see their health care provider and be treated if they have problems, such as a mild rash or itching.  During treatment, it is important that you follow these instructions:  Avoid sexual activity or use condoms correctly.  Do not douche.  Avoid alcohol as directed by your health care provider.  Avoid breastfeeding as directed by your health care provider. SEEK MEDICAL CARE IF:   Your symptoms are not improving after 3 days of treatment.  You have increased discharge or pain.  You have a fever. MAKE SURE YOU:   Understand these instructions.  Will watch your condition.  Will get help right away if you are not doing well or get worse. FOR MORE INFORMATION  Centers for Disease Control and Prevention, Division of STD Prevention: www.cdc.gov/std American Sexual Health Association (ASHA): www.ashastd.org      This information is not intended to replace advice given to you by your health care provider. Make sure you discuss any questions you have with your health care provider.   Document Released: 10/21/2005 Document Revised: 11/11/2014 Document Reviewed: 06/02/2013 Elsevier Interactive Patient Education 2016 Elsevier Inc.  

## 2016-03-23 NOTE — ED Provider Notes (Signed)
[facility] Emergency Department Provider Note        Time seen: ----------------------------------------- 8:13 AM on 03/23/2016 -----------------------------------------    I have reviewed the triage vital signs and the nursing notes.   HISTORY  Chief Complaint Abdominal Pain    HPI Victoria Soto is a 30 y.o. female who presents ER for abdominal pain and cramping. Patient reports she is late for her menstrual cycle. Last one was in April. Patient is also stating she has vaginal discharge and concerned she had a yeast infection. She denies any vaginal bleeding, denies vomiting or diarrhea. She does have some dysuria.   Past Medical History  Diagnosis Date  . No pertinent past medical history   . ZOXWRUEA(540.9)     Patient Active Problem List   Diagnosis Date Noted  . Encounter for IUD removal 05/16/2015  . Smoker 04/25/2015  . NSVD (normal spontaneous vaginal delivery) 05/19/2012    Past Surgical History  Procedure Laterality Date  . No past surgeries      Allergies Review of patient's allergies indicates no known allergies.  Social History Social History  Substance Use Topics  . Smoking status: Current Every Day Smoker -- 0.20 packs/day for 10 years    Types: Cigarettes  . Smokeless tobacco: Never Used  . Alcohol Use: No    Review of Systems Constitutional: Negative for fever. Cardiovascular: Negative for chest pain. Respiratory: Negative for shortness of breath. Gastrointestinal: Positive for abdominal pain Genitourinary: Positive for dysuria, vaginal discharge Musculoskeletal: Negative for back pain. Skin: Negative for rash.  10-point ROS otherwise negative.  ____________________________________________   PHYSICAL EXAM:  VITAL SIGNS: ED Triage Vitals  Enc Vitals Group     BP 03/23/16 0704 124/69 mmHg     Pulse Rate 03/23/16 0704 78     Resp 03/23/16 0704 20     Temp 03/23/16 0704 97.9 F (36.6 C)     Temp  Source 03/23/16 0704 Oral     SpO2 03/23/16 0704 100 %     Weight 03/23/16 0704 120 lb (54.432 kg)     Height 03/23/16 0704  (1.626 m)     Head Cir --      Peak Flow --      Pain Score 03/23/16 0704 4     Pain Loc --      Pain Edu? --      Excl. in GC? --     Constitutional: Alert and oriented. Well appearing and in no distress. Eyes: Conjunctivae are normal. PERRL. Normal extraocular movements. ENT   Head: Normocephalic and atraumatic.   Nose: No congestion/rhinnorhea.   Mouth/Throat: Mucous membranes are moist.   Neck: No stridor. Cardiovascular: Normal rate, regular rhythm. No murmurs, rubs, or gallops. Respiratory: Normal respiratory effort without tachypnea nor retractions. Breath sounds are clear and equal bilaterally. No wheezes/rales/rhonchi. Gastrointestinal: Soft and nontender. Normal bowel sounds Genitourinary: Yellow vaginal discharge, no cervical motion tenderness or adnexal tenderness. Musculoskeletal: Nontender with normal range of motion in all extremities. No lower extremity tenderness nor edema. Neurologic:  Normal speech and language. No gross focal neurologic deficits are appreciated.  Skin:  Skin is warm, dry and intact. No rash noted. Psychiatric: Mood and affect are normal. Speech and behavior are normal.  ____________________________________________  ED COURSE:  Pertinent labs & imaging results that were available during my care of the patient were reviewed by me and considered in my medical decision making (see chart for details). Patient resents to ER with pelvic  pain and vaginal discharge. We will check basic labs, pregnancy testing and reevaluate. ____________________________________________    LABS (pertinent positives/negatives)  Labs Reviewed  WET PREP, GENITAL - Abnormal; Notable for the following:    Clue Cells Wet Prep HPF POC PRESENT (*)    WBC, Wet Prep HPF POC FEW (*)    All other components within normal limits   COMPREHENSIVE METABOLIC PANEL - Abnormal; Notable for the following:    AST 13 (*)    ALT 10 (*)    All other components within normal limits  URINALYSIS COMPLETEWITH MICROSCOPIC ([facility] ONLY) - Abnormal; Notable for the following:    Color, Urine YELLOW (*)    APPearance CLEAR (*)    Leukocytes, UA TRACE (*)    Squamous Epithelial / LPF 0-5 (*)    All other components within normal limits  CHLAMYDIA/NGC RT PCR ([facility] ONLY)  LIPASE, BLOOD  CBC  POC URINE PREG, ED  POCT PREGNANCY, URINE  ____________________________________________  FINAL ASSESSMENT AND PLAN  Pelvic pain, Bacterial vaginosis  Plan: Patient with labs as dictated above. Patient does not think she is at risk for gonorrhea or chlamydia. She'll be discharged with Flagyl and encouraged to have close follow-up with her doctor.   Emily Filbert, MD   Note: This dictation was prepared with Dragon dictation. Any transcriptional errors that result from this process are unintentional   Emily Filbert, MD 03/23/16 [phone]

## 2016-03-23 NOTE — ED Notes (Signed)
Pt to ed with c/o abd pain and cramping.  Pt states she is late for her period.  LMP 02/18/16.  Pt also reports "i think I have a yeast infection"

## 2016-07-20 ENCOUNTER — Emergency Department (HOSPITAL_COMMUNITY)
Admission: EM | Admit: 2016-07-20 | Discharge: 2016-07-20 | Disposition: A | Discharge status: Home / Self Care | Payer: Self-pay | Attending: Emergency Medicine | Admitting: Emergency Medicine

## 2016-07-20 ENCOUNTER — Encounter (HOSPITAL_COMMUNITY): Payer: Self-pay | Admitting: Emergency Medicine

## 2016-07-20 DIAGNOSIS — R110 Nausea: Secondary | ICD-10-CM

## 2016-07-20 DIAGNOSIS — R112 Nausea with vomiting, unspecified: Secondary | ICD-10-CM | POA: Insufficient documentation

## 2016-07-20 DIAGNOSIS — R102 Pelvic and perineal pain: Secondary | ICD-10-CM | POA: Insufficient documentation

## 2016-07-20 DIAGNOSIS — F17210 Nicotine dependence, cigarettes, uncomplicated: Secondary | ICD-10-CM | POA: Insufficient documentation

## 2016-07-20 LAB — COMPREHENSIVE METABOLIC PANEL
ALK PHOS: 57 U/L (ref 38–126)
ALT: 10 U/L — AB (ref 14–54)
AST: 12 U/L — ABNORMAL LOW (ref 15–41)
Albumin: 4.3 g/dL (ref 3.5–5.0)
Anion gap: 8 (ref 5–15)
BILIRUBIN TOTAL: 1.1 mg/dL (ref 0.3–1.2)
BUN: 13 mg/dL (ref 6–20)
CALCIUM: 8.9 mg/dL (ref 8.9–10.3)
CO2: 25 mmol/L (ref 22–32)
CREATININE: 0.63 mg/dL (ref 0.44–1.00)
Chloride: 106 mmol/L (ref 101–111)
Glucose, Bld: 87 mg/dL (ref 65–99)
Potassium: 3.4 mmol/L — ABNORMAL LOW (ref 3.5–5.1)
SODIUM: 139 mmol/L (ref 135–145)
TOTAL PROTEIN: 7 g/dL (ref 6.5–8.1)

## 2016-07-20 LAB — URINALYSIS, ROUTINE W REFLEX MICROSCOPIC
BILIRUBIN URINE: NEGATIVE
Glucose, UA: NEGATIVE mg/dL
Ketones, ur: NEGATIVE mg/dL
Leukocytes, UA: NEGATIVE
Nitrite: NEGATIVE
PROTEIN: NEGATIVE mg/dL
Specific Gravity, Urine: 1.02 (ref 1.005–1.030)
pH: 6.5 (ref 5.0–8.0)

## 2016-07-20 LAB — CBC
HCT: 43.1 % (ref 36.0–46.0)
Hemoglobin: 14.5 g/dL (ref 12.0–15.0)
MCH: 30.3 pg (ref 26.0–34.0)
MCHC: 33.6 g/dL (ref 30.0–36.0)
MCV: 90.2 fL (ref 78.0–100.0)
PLATELETS: 220 10*3/uL (ref 150–400)
RBC: 4.78 MIL/uL (ref 3.87–5.11)
RDW: 12.4 % (ref 11.5–15.5)
WBC: 9.5 10*3/uL (ref 4.0–10.5)

## 2016-07-20 LAB — WET PREP, GENITAL
Clue Cells Wet Prep HPF POC: NONE SEEN
SPERM: NONE SEEN
TRICH WET PREP: NONE SEEN
YEAST WET PREP: NONE SEEN

## 2016-07-20 LAB — HCG, QUANTITATIVE, PREGNANCY: hCG, Beta Chain, Quant, S: <1 m[IU]/mL (ref <5)

## 2016-07-20 LAB — URINE MICROSCOPIC-ADD ON

## 2016-07-20 LAB — PREGNANCY, URINE: PREG TEST UR: NEGATIVE

## 2016-07-20 LAB — LIPASE, BLOOD: Lipase: 13 U/L (ref 11–51)

## 2016-07-20 MED ORDER — TRAMADOL HCL 50 MG PO TABS: 50.0000 mg | ORAL_TABLET | Freq: Four times a day (QID) | ORAL | 0 refills | Status: DC | PRN

## 2016-07-20 MED ORDER — NAPROXEN 500 MG PO TABS: 500.0000 mg | ORAL_TABLET | Freq: Two times a day (BID) | ORAL | 0 refills | Status: DC

## 2016-07-20 MED ORDER — PROMETHAZINE HCL 25 MG PO TABS: 25.0000 mg | ORAL_TABLET | Freq: Four times a day (QID) | ORAL | 0 refills | Status: DC | PRN

## 2016-07-20 MED ORDER — KETOROLAC TROMETHAMINE 60 MG/2ML IM SOLN
60.0000 mg | Freq: Once | INTRAMUSCULAR | Status: AC
  Administered 2016-07-20: 60 mg via INTRAMUSCULAR
  Filled 2016-07-20: qty 2

## 2016-07-20 MED ORDER — ONDANSETRON 8 MG PO TBDP
8.0000 mg | ORAL_TABLET | Freq: Once | ORAL | Status: AC
  Administered 2016-07-20: 8 mg via ORAL
  Filled 2016-07-20: qty 1

## 2016-07-20 NOTE — ED Triage Notes (Signed)
PT c/o epigastric and generalized abdominal cramping x2 weeks with nausea/vomiting x1 yesterday. PT denies any urinary symptoms. Last BM reported to be normal 07/19/16.

## 2016-07-21 NOTE — ED Provider Notes (Signed)
[facility] DEPT Provider Note   CSN: [id number] Arrival date & time: 07/20/16  1051     History   Chief Complaint Chief Complaint  Patient presents with  . Abdominal Pain    HPI Victoria Soto is a 30 y.o. female G2P2 presenting with a 2 week history of daily nausea with one episode of vomiting yesterday along with intermittent epigastric pain and bilateral lower pelvic pain which is constant and reminds her of menstrual cramping.  She reports an equivocal home pregnancy test this week.  Her menstrual periods have been irregular, stating she had a normal period in July, none in August, but then had a 3 day heavy period Sept 1-3.  Historically her periods have been regular.  She denies fevers or chills, back pain, dysuria, diarrhea with last bm ytd and normal.  She is in a monogamous relationship, denies vaginal discharge.  Her upper abdominal pain is random, aching and not associated with meals.  She denies heartburn or h/o PUD.   The history is provided by the patient.    Past Medical History:  Diagnosis Date  . Headache(784.0)   . No pertinent past medical history     Patient Active Problem List   Diagnosis Date Noted  . Encounter for IUD removal 05/16/2015  . Smoker 04/25/2015  . NSVD (normal spontaneous vaginal delivery) 05/19/2012    Past Surgical History:  Procedure Laterality Date  . NO PAST SURGERIES      OB History    Gravida Para Term Preterm AB Living   3 2 2  0 1 2   SAB TAB Ectopic Multiple Live Births   1 0 0 0 2       Home Medications    Prior to Admission medications   Medication Sig Start Date End Date Taking? Authorizing Provider  acetaminophen-codeine (TYLENOL #3) 300-30 MG per tablet Take 1-2 tablets by mouth every 6 (six) hours as needed for moderate pain. Patient not taking: Reported on 07/20/2016 06/24/15   Burgess Amor, PA-C  dicyclomine (BENTYL) 20 MG tablet Take 1 tablet (20 mg total) by mouth 2 (two) times daily. Patient not taking:  Reported on 07/20/2016 12/29/15   Gerhard Munch, MD  ibuprofen (ADVIL,MOTRIN) 800 MG tablet Take 1 tablet (800 mg total) by mouth every 8 (eight) hours as needed. Patient not taking: Reported on 07/20/2016 03/23/16   Emily Filbert, MD  metroNIDAZOLE (FLAGYL) 500 MG tablet Take 1 tablet (500 mg total) by mouth 2 (two) times daily. Patient not taking: Reported on 07/20/2016 03/23/16   Emily Filbert, MD  naproxen (NAPROSYN) 500 MG tablet Take 1 tablet (500 mg total) by mouth 2 (two) times daily. 07/20/16   Burgess Amor, PA-C  promethazine (PHENERGAN) 25 MG tablet Take 1 tablet (25 mg total) by mouth every 6 (six) hours as needed for nausea or vomiting. 07/20/16   Burgess Amor, PA-C  traMADol (ULTRAM) 50 MG tablet Take 1 tablet (50 mg total) by mouth every 6 (six) hours as needed. 07/20/16   Burgess Amor, PA-C    Family History Family History  Problem Relation Age of Onset  . Muscular dystrophy Other   . Hypertension Mother   . Diabetes Father   . Anesthesia problems Neg Hx     Social History Social History  Substance Use Topics  . Smoking status: Current Every Day Smoker    Packs/day: 0.20    Years: 10.00    Types: Cigarettes  . Smokeless tobacco: Never Used  .  Alcohol use No     Allergies   Review of patient's allergies indicates no known allergies.   Review of Systems Review of Systems  Constitutional: Negative for chills and fever.  HENT: Negative for congestion, ear pain and sore throat.   Eyes: Negative.   Respiratory: Negative for cough, chest tightness and shortness of breath.   Cardiovascular: Negative for chest pain and palpitations.  Gastrointestinal: Positive for abdominal distention, nausea and vomiting. Negative for abdominal pain.  Genitourinary: Positive for menstrual problem and pelvic pain. Negative for dyspareunia, dysuria, flank pain, frequency, hematuria, urgency, vaginal discharge and vaginal pain.  Musculoskeletal: Negative for arthralgias, joint swelling  and neck pain.  Skin: Negative.  Negative for rash and wound.  Neurological: Negative for dizziness, seizures, syncope, weakness, light-headedness, numbness and headaches.  Psychiatric/Behavioral: Negative.   All other systems reviewed and are negative.    Physical Exam Updated Vital Signs BP 111/86 (BP Location: Left Arm)   Pulse 74   Temp 98.9 F (37.2 C) (Oral)   Resp 18   Ht 5\' 4"  (1.626 m)   Wt 59 kg   LMP 07/03/2016   SpO2 99%   BMI 22.31 kg/m   Physical Exam  Constitutional: She appears well-developed and well-nourished.  HENT:  Head: Normocephalic and atraumatic.  Eyes: Conjunctivae are normal.  Neck: Normal range of motion.  Cardiovascular: Normal rate, regular rhythm, normal heart sounds and intact distal pulses.   Pulmonary/Chest: Effort normal and breath sounds normal. She has no wheezes.  Abdominal: Soft. Bowel sounds are normal. She exhibits no distension and no mass. There is tenderness in the epigastric area. There is no rebound, no guarding and negative Murphy's sign.  Genitourinary: Vagina normal and uterus normal. There is no rash, tenderness or lesion on the right labia. There is no rash, tenderness or lesion on the left labia. Cervix exhibits no motion tenderness, no discharge and no friability. Right adnexum displays no mass, no tenderness and no fullness. Left adnexum displays no mass, no tenderness and no fullness. No tenderness in the vagina. No vaginal discharge found.  Musculoskeletal: Normal range of motion.  Neurological: She is alert.  Skin: Skin is warm and dry.  Psychiatric: She has a normal mood and affect.  Nursing note and vitals reviewed.    ED Treatments / Results  Labs (all labs ordered are listed, but only abnormal results are displayed)  Results for orders placed or performed during the hospital encounter of 07/20/16  Wet prep, genital  Result Value Ref Range   Yeast Wet Prep HPF POC NONE SEEN NONE SEEN   Trich, Wet Prep NONE  SEEN NONE SEEN   Clue Cells Wet Prep HPF POC NONE SEEN NONE SEEN   WBC, Wet Prep HPF POC FEW (A) NONE SEEN   Sperm NONE SEEN   Lipase, blood  Result Value Ref Range   Lipase 13 11 - 51 U/L  Comprehensive metabolic panel  Result Value Ref Range   Sodium 139 135 - 145 mmol/L   Potassium 3.4 (L) 3.5 - 5.1 mmol/L   Chloride 106 101 - 111 mmol/L   CO2 25 22 - 32 mmol/L   Glucose, Bld 87 65 - 99 mg/dL   BUN 13 6 - 20 mg/dL   Creatinine, Ser 5.69 0.44 - 1.00 mg/dL   Calcium 8.9 8.9 - 79.4 mg/dL   Total Protein 7.0 6.5 - 8.1 g/dL   Albumin 4.3 3.5 - 5.0 g/dL   AST 12 (L) 15 - 41 U/L  ALT 10 (L) 14 - 54 U/L   Alkaline Phosphatase 57 38 - 126 U/L   Total Bilirubin 1.1 0.3 - 1.2 mg/dL   GFR calc non Af Amer >60 >60 mL/min   GFR calc Af Amer >60 >60 mL/min   Anion gap 8 5 - 15  CBC  Result Value Ref Range   WBC 9.5 4.0 - 10.5 K/uL   RBC 4.78 3.87 - 5.11 MIL/uL   Hemoglobin 14.5 12.0 - 15.0 g/dL   HCT 69.6 29.5 - 28.4 %   MCV 90.2 78.0 - 100.0 fL   MCH 30.3 26.0 - 34.0 pg   MCHC 33.6 30.0 - 36.0 g/dL   RDW 13.2 44.0 - 10.2 %   Platelets 220 150 - 400 K/uL  Urinalysis, Routine w reflex microscopic  Result Value Ref Range   Color, Urine YELLOW YELLOW   APPearance HAZY (A) CLEAR   Specific Gravity, Urine 1.020 1.005 - 1.030   pH 6.5 5.0 - 8.0   Glucose, UA NEGATIVE NEGATIVE mg/dL   Hgb urine dipstick TRACE (A) NEGATIVE   Bilirubin Urine NEGATIVE NEGATIVE   Ketones, ur NEGATIVE NEGATIVE mg/dL   Protein, ur NEGATIVE NEGATIVE mg/dL   Nitrite NEGATIVE NEGATIVE   Leukocytes, UA NEGATIVE NEGATIVE  Pregnancy, urine  Result Value Ref Range   Preg Test, Ur NEGATIVE NEGATIVE  Urine microscopic-add on  Result Value Ref Range   Squamous Epithelial / LPF 0-5 (A) NONE SEEN   WBC, UA 0-5 0 - 5 WBC/hpf   RBC / HPF 0-5 0 - 5 RBC/hpf   Bacteria, UA FEW (A) NONE SEEN   Urine-Other MUCOUS PRESENT   hCG, quantitative, pregnancy  Result Value Ref Range   hCG, Beta Chain, Quant, S <1 <5  mIU/mL   No results found.  EKG  EKG Interpretation None       Radiology No results found.  Procedures Procedures (including critical care time)  Medications Ordered in ED Medications  ondansetron (ZOFRAN-ODT) disintegrating tablet 8 mg (8 mg Oral Given 07/20/16 1228)  ketorolac (TORADOL) injection 60 mg (60 mg Intramuscular Given 07/20/16 1404)     Initial Impression / Assessment and Plan / ED Course  I have reviewed the triage vital signs and the nursing notes.  Pertinent labs & imaging results that were available during my care of the patient were reviewed by me and considered in my medical decision making (see chart for details).  Clinical Course    Labs reviewed and discussed with patient.  Both epigastric and bilateral low pelvic pain of unclear etiology.  Re-exam of abdomen still benign.  No localizing sx.  Given change in menstrual pattern, suggested gyn f/u. Referral given.   Prescriptions below.  Advised prn f/u here for worsened sx/fevers or new sx.    Final Clinical Impressions(s) / ED Diagnoses   Final diagnoses:  Nausea  Pelvic pain in female    New Prescriptions Discharge Medication List as of 07/20/2016  2:23 PM    START taking these medications   Details  naproxen (NAPROSYN) 500 MG tablet Take 1 tablet (500 mg total) by mouth 2 (two) times daily., Starting Sat 07/20/2016, Print    promethazine (PHENERGAN) 25 MG tablet Take 1 tablet (25 mg total) by mouth every 6 (six) hours as needed for nausea or vomiting., Starting Sat 07/20/2016, Print    traMADol (ULTRAM) 50 MG tablet Take 1 tablet (50 mg total) by mouth every 6 (six) hours as needed., Starting Sat 07/20/2016, Print  Burgess AmorJulie Aubriel Khanna, PA-C 07/21/16 1928    Vanetta MuldersScott Zackowski, MD 07/21/16 2127

## 2016-07-22 LAB — GC/CHLAMYDIA PROBE AMP (~~LOC~~) NOT AT ARMC
Chlamydia: NEGATIVE
NEISSERIA GONORRHEA: NEGATIVE

## 2016-10-08 ENCOUNTER — Emergency Department (HOSPITAL_COMMUNITY)
Admission: EM | Admit: 2016-10-08 | Discharge: 2016-10-08 | Disposition: A | Discharge status: Home / Self Care | Payer: Medicaid Other | Attending: Emergency Medicine | Admitting: Emergency Medicine

## 2016-10-08 ENCOUNTER — Encounter (HOSPITAL_COMMUNITY): Payer: Self-pay | Admitting: *Deleted

## 2016-10-08 DIAGNOSIS — N898 Other specified noninflammatory disorders of vagina: Secondary | ICD-10-CM | POA: Insufficient documentation

## 2016-10-08 DIAGNOSIS — R102 Pelvic and perineal pain: Secondary | ICD-10-CM | POA: Insufficient documentation

## 2016-10-08 DIAGNOSIS — Z79899 Other long term (current) drug therapy: Secondary | ICD-10-CM | POA: Diagnosis not present

## 2016-10-08 DIAGNOSIS — R1032 Left lower quadrant pain: Secondary | ICD-10-CM | POA: Insufficient documentation

## 2016-10-08 DIAGNOSIS — F17210 Nicotine dependence, cigarettes, uncomplicated: Secondary | ICD-10-CM | POA: Diagnosis not present

## 2016-10-08 DIAGNOSIS — R1030 Lower abdominal pain, unspecified: Secondary | ICD-10-CM | POA: Diagnosis present

## 2016-10-08 DIAGNOSIS — R1031 Right lower quadrant pain: Secondary | ICD-10-CM | POA: Insufficient documentation

## 2016-10-08 DIAGNOSIS — Z791 Long term (current) use of non-steroidal anti-inflammatories (NSAID): Secondary | ICD-10-CM | POA: Insufficient documentation

## 2016-10-08 LAB — COMPREHENSIVE METABOLIC PANEL
ALK PHOS: 51 U/L (ref 38–126)
ALT: 10 U/L — ABNORMAL LOW (ref 14–54)
ANION GAP: 6 (ref 5–15)
AST: 12 U/L — ABNORMAL LOW (ref 15–41)
Albumin: 3.8 g/dL (ref 3.5–5.0)
BILIRUBIN TOTAL: 0.7 mg/dL (ref 0.3–1.2)
BUN: 13 mg/dL (ref 6–20)
CALCIUM: 8.7 mg/dL — AB (ref 8.9–10.3)
CO2: 25 mmol/L (ref 22–32)
Chloride: 104 mmol/L (ref 101–111)
Creatinine, Ser: 0.54 mg/dL (ref 0.44–1.00)
Glucose, Bld: 103 mg/dL — ABNORMAL HIGH (ref 65–99)
Potassium: 4.1 mmol/L (ref 3.5–5.1)
Sodium: 135 mmol/L (ref 135–145)
TOTAL PROTEIN: 6.7 g/dL (ref 6.5–8.1)

## 2016-10-08 LAB — URINALYSIS, ROUTINE W REFLEX MICROSCOPIC
BILIRUBIN URINE: NEGATIVE
Glucose, UA: NEGATIVE mg/dL
Hgb urine dipstick: NEGATIVE
Ketones, ur: NEGATIVE mg/dL
Leukocytes, UA: NEGATIVE
NITRITE: NEGATIVE
Protein, ur: NEGATIVE mg/dL
SPECIFIC GRAVITY, URINE: 1.025 (ref 1.005–1.030)
pH: 6 (ref 5.0–8.0)

## 2016-10-08 LAB — LIPASE, BLOOD: Lipase: 18 U/L (ref 11–51)

## 2016-10-08 LAB — CBC
HCT: 44.1 % (ref 36.0–46.0)
HEMOGLOBIN: 15 g/dL (ref 12.0–15.0)
MCH: 30.9 pg (ref 26.0–34.0)
MCHC: 34 g/dL (ref 30.0–36.0)
MCV: 90.7 fL (ref 78.0–100.0)
Platelets: 214 10*3/uL (ref 150–400)
RBC: 4.86 MIL/uL (ref 3.87–5.11)
RDW: 12.6 % (ref 11.5–15.5)
WBC: 10 10*3/uL (ref 4.0–10.5)

## 2016-10-08 LAB — PREGNANCY, URINE: PREG TEST UR: NEGATIVE

## 2016-10-08 MED ORDER — DICYCLOMINE HCL 20 MG PO TABS: 20.0000 mg | ORAL_TABLET | Freq: Two times a day (BID) | ORAL | 0 refills | Status: DC | PRN

## 2016-10-08 NOTE — ED Provider Notes (Signed)
[facility] Provider Note   CSN: [id number] Arrival date & time: 10/08/16  1608  By signing my name below, I, Linna Darner, attest that this documentation has been prepared under the direction and in the presence of physician practitioner, Loren Racer, MD. Electronically Signed: Linna Darner, Scribe. 10/08/2016. 9:46 PM.  History   Chief Complaint Chief Complaint  Patient presents with  . Abdominal Pain    The history is provided by the patient. No language interpreter was used.     HPI Comments: Victoria Soto is a 30 y.o. female who presents to the Emergency Department complaining of constant, cramping, suprapubic abdominal pain for the last 5 days. She states she is 5 days late for her menstrual cycle and has taken multiple pregnancy tests over the last few days that have all come back negative. She reports she has had vaginal discharge for the last week. Pt notes she has been having regular bowel movements; her most recent one was 2 days ago. She has one child and states her current pain is similar to when she was previously pregnant. She denies fever, chills, tremors, dysuria, urinary frequency, nausea, vomiting, diarrhea, or any other associated symptoms.  Past Medical History:  Diagnosis Date  . Headache(784.0)   . No pertinent past medical history     Patient Active Problem List   Diagnosis Date Noted  . Encounter for IUD removal 05/16/2015  . Smoker 04/25/2015  . NSVD (normal spontaneous vaginal delivery) 05/19/2012    Past Surgical History:  Procedure Laterality Date  . NO PAST SURGERIES      OB History    Gravida Para Term Preterm AB Living   3 2 2  0 1 2   SAB TAB Ectopic Multiple Live Births   1 0 0 0 2       Home Medications    Prior to Admission medications   Medication Sig Start Date End Date Taking? Authorizing Provider  acetaminophen-codeine (TYLENOL #3) 300-30 MG per tablet Take 1-2 tablets by mouth every 6 (six) hours as needed for  moderate pain. Patient not taking: Reported on 07/20/2016 06/24/15   Burgess Amor, PA-C  dicyclomine (BENTYL) 20 MG tablet Take 1 tablet (20 mg total) by mouth 2 (two) times daily as needed for spasms. 10/08/16   Loren Racer, MD  ibuprofen (ADVIL,MOTRIN) 800 MG tablet Take 1 tablet (800 mg total) by mouth every 8 (eight) hours as needed. Patient not taking: Reported on 07/20/2016 03/23/16   Emily Filbert, MD  metroNIDAZOLE (FLAGYL) 500 MG tablet Take 1 tablet (500 mg total) by mouth 2 (two) times daily. Patient not taking: Reported on 07/20/2016 03/23/16   Emily Filbert, MD  naproxen (NAPROSYN) 500 MG tablet Take 1 tablet (500 mg total) by mouth 2 (two) times daily. 07/20/16   Burgess Amor, PA-C  promethazine (PHENERGAN) 25 MG tablet Take 1 tablet (25 mg total) by mouth every 6 (six) hours as needed for nausea or vomiting. 07/20/16   Burgess Amor, PA-C  traMADol (ULTRAM) 50 MG tablet Take 1 tablet (50 mg total) by mouth every 6 (six) hours as needed. 07/20/16   Burgess Amor, PA-C    Family History Family History  Problem Relation Age of Onset  . Muscular dystrophy Other   . Hypertension Mother   . Diabetes Father   . Anesthesia problems Neg Hx     Social History Social History  Substance Use Topics  . Smoking status: Current Every Day Smoker    Packs/day:  0.50    Years: 10.00    Types: Cigarettes  . Smokeless tobacco: Never Used  . Alcohol use No     Allergies   Patient has no known allergies.   Review of Systems Review of Systems  Constitutional: Negative for chills and fever.  Gastrointestinal: Positive for abdominal pain. Negative for constipation, diarrhea, nausea and vomiting.  Genitourinary: Positive for pelvic pain and vaginal discharge. Negative for difficulty urinating, dysuria, flank pain, frequency, hematuria, menstrual problem and vaginal bleeding.  Musculoskeletal: Negative for back pain, myalgias and neck pain.  Skin: Negative for rash and wound.  Neurological:  Negative for dizziness, weakness, light-headedness, numbness and headaches.  All other systems reviewed and are negative.    Physical Exam Updated Vital Signs BP 108/78   Pulse 75   Temp 98.3 F (36.8 C) (Oral)   Resp 16   Ht 5\' 4"  (1.626 m)   Wt 120 lb (54.4 kg)   LMP 09/07/2016   SpO2 100%   BMI 20.60 kg/m   Physical Exam  Constitutional: She is oriented to person, place, and time. She appears well-developed and well-nourished.  HENT:  Head: Normocephalic and atraumatic.  Mouth/Throat: Oropharynx is clear and moist.  Eyes: EOM are normal. Pupils are equal, round, and reactive to light.  Neck: Normal range of motion. Neck supple.  Cardiovascular: Normal rate and regular rhythm.   Pulmonary/Chest: Effort normal and breath sounds normal.  Abdominal: Soft. Bowel sounds are normal. There is no tenderness. There is no rebound and no guarding.  Musculoskeletal: Normal range of motion. She exhibits no edema or tenderness.  No CVA tenderness bilaterally.  Neurological: She is alert and oriented to person, place, and time.  Skin: Skin is warm and dry. No rash noted. No erythema.  Psychiatric: She has a normal mood and affect. Her behavior is normal.  Nursing note and vitals reviewed.    ED Treatments / Results  Labs (all labs ordered are listed, but only abnormal results are displayed) Labs Reviewed  COMPREHENSIVE METABOLIC PANEL - Abnormal; Notable for the following:       Result Value   Glucose, Bld 103 (*)    Calcium 8.7 (*)    AST 12 (*)    ALT 10 (*)    All other components within normal limits  LIPASE, BLOOD  CBC  URINALYSIS, ROUTINE W REFLEX MICROSCOPIC  PREGNANCY, URINE    EKG  EKG Interpretation None       Radiology No results found.  Procedures Procedures (including critical care time)  DIAGNOSTIC STUDIES: Oxygen Saturation is 100% on RA, normal by my interpretation.    COORDINATION OF CARE: 9:50 PM Discussed treatment plan with pt at bedside  and pt agreed to plan.  Medications Ordered in ED Medications - No data to display   Initial Impression / Assessment and Plan / ED Course  I have reviewed the triage vital signs and the nursing notes.  Pertinent labs & imaging results that were available during my care of the patient were reviewed by me and considered in my medical decision making (see chart for details).  Clinical Course     I personally performed the services described in this documentation, which was scribed in my presence. The recorded information has been reviewed and is accurate.   Patient refusing pelvic exam. Understands the need to follow up with her primary physician. Return precautions given.  Final Clinical Impressions(s) / ED Diagnoses   Final diagnoses:  Bilateral lower abdominal cramping  New Prescriptions Discharge Medication List as of 10/08/2016 10:02 PM       Loren Racer, MD 10/08/16 [phone]

## 2016-10-08 NOTE — ED Triage Notes (Signed)
Pt reports lower abdominal cramping x 5 days. Nausea in the evening. Pt reports she is 5 days late for her menstrual cycle. Pt reports he is currently trying to get pregnant and has taken several pregnancy tests but they are all negative. Denies vomiting, diarrhea, fever. Pt reports she felt this same way when she was pregnant with her first child.

## 2017-02-25 ENCOUNTER — Encounter (HOSPITAL_COMMUNITY): Payer: Self-pay

## 2017-02-25 ENCOUNTER — Inpatient Hospital Stay (HOSPITAL_COMMUNITY)
Admission: AD | Admit: 2017-02-25 | Discharge: 2017-02-26 | Disposition: A | Discharge status: Home / Self Care | Payer: Medicaid Other | Source: Ambulatory Visit | Attending: Family Medicine | Admitting: Family Medicine

## 2017-02-25 ENCOUNTER — Inpatient Hospital Stay (HOSPITAL_COMMUNITY): Payer: Medicaid Other

## 2017-02-25 DIAGNOSIS — O26892 Other specified pregnancy related conditions, second trimester: Secondary | ICD-10-CM | POA: Insufficient documentation

## 2017-02-25 DIAGNOSIS — Z79899 Other long term (current) drug therapy: Secondary | ICD-10-CM | POA: Insufficient documentation

## 2017-02-25 DIAGNOSIS — Z3A08 8 weeks gestation of pregnancy: Secondary | ICD-10-CM | POA: Insufficient documentation

## 2017-02-25 DIAGNOSIS — R109 Unspecified abdominal pain: Secondary | ICD-10-CM

## 2017-02-25 DIAGNOSIS — Z3491 Encounter for supervision of normal pregnancy, unspecified, first trimester: Secondary | ICD-10-CM

## 2017-02-25 DIAGNOSIS — O26899 Other specified pregnancy related conditions, unspecified trimester: Secondary | ICD-10-CM

## 2017-02-25 DIAGNOSIS — R1032 Left lower quadrant pain: Secondary | ICD-10-CM | POA: Diagnosis present

## 2017-02-25 DIAGNOSIS — Z87891 Personal history of nicotine dependence: Secondary | ICD-10-CM | POA: Insufficient documentation

## 2017-02-25 LAB — URINALYSIS, ROUTINE W REFLEX MICROSCOPIC
Bilirubin Urine: NEGATIVE
Glucose, UA: NEGATIVE mg/dL
HGB URINE DIPSTICK: NEGATIVE
KETONES UR: NEGATIVE mg/dL
Leukocytes, UA: NEGATIVE
NITRITE: NEGATIVE
PH: 6 (ref 5.0–8.0)
Protein, ur: NEGATIVE mg/dL
Specific Gravity, Urine: 1.025 (ref 1.005–1.030)

## 2017-02-25 LAB — CBC
HCT: 39.4 % (ref 36.0–46.0)
HEMOGLOBIN: 13.5 g/dL (ref 12.0–15.0)
MCH: 30.3 pg (ref 26.0–34.0)
MCHC: 34.3 g/dL (ref 30.0–36.0)
MCV: 88.3 fL (ref 78.0–100.0)
Platelets: 229 10*3/uL (ref 150–400)
RBC: 4.46 MIL/uL (ref 3.87–5.11)
RDW: 12.4 % (ref 11.5–15.5)
WBC: 9.6 10*3/uL (ref 4.0–10.5)

## 2017-02-25 LAB — POCT PREGNANCY, URINE: PREG TEST UR: POSITIVE — AB

## 2017-02-25 NOTE — MAU Provider Note (Signed)
Chief Complaint: Abdominal Pain   First Provider Initiated Contact with Patient 02/25/17 2241     SUBJECTIVE HPI: Victoria Soto is a 31 y.o. [id number] at [redacted]w[redacted]d by LMP who presents to [facility] reporting LLQ pain x 4 days. [facility]. Had US showing GS, but no YS. Followed up 2 days later. Was told Quant rose appropriately. Pain worsened today so she decided to F/U at [facility]U. Quant 4/25 3,000. Records received.   Vaginal Bleeding: None Passage of tissue or clots: None Dizziness: None  O Pos  Pain Location: LLQ Quality: sharp Severity: Moderate Duration: 4 days Course: Worsening Context: Early pregnancy Timing: Intermittent Modifying factors: Hasn't tried anything Associated signs and symptoms: Neg for fever, chills, N/V/D/C, vaginal bleeding, vaginal discharge, urinary complaints.   Past Medical History:  Diagnosis Date  . Headache(784.0)   . No pertinent past medical history    OB History  Gravida Para Term Preterm AB Living  4 2 2  0 1 2  SAB TAB Ectopic Multiple Live Births  1 0 0 0 2    # Outcome Date GA Lbr Len/2nd Weight Sex Delivery Anes PTL Lv  4 Current           3 Term 05/19/12 [redacted]w[redacted]d 11:48 / 00:17 7 lb 12.2 oz (3.52 kg) M Vag-Spont EPI  LIV     Birth Comments: Normal newborn  2 Term 2009 [redacted]w[redacted]d  7 lb 14 oz (3.572 kg) F Vag-Spont   LIV     Birth Comments: late prenatal care  1 SAB 2007             Past Surgical History:  Procedure Laterality Date  . NO PAST SURGERIES     Social History   Social History  . Marital status: Married    Spouse name: N/A  . Number of children: N/A  . Years of education: N/A   Occupational History  . Not on file.   Social History Main Topics  . Smoking status: Former Smoker    Packs/day: 0.50    Years: 10.00    Types: Cigarettes    Quit date: 02/18/2017  . Smokeless tobacco: Never Used  . Alcohol use No  . Drug use: No  . Sexual activity: Yes    Birth control/ protection: Condom   Other  Topics Concern  . Not on file   Social History Narrative  . No narrative on file   No current facility-administered medications on file prior to encounter.    Current Outpatient Prescriptions on File Prior to Encounter  Medication Sig Dispense Refill  . acetaminophen-codeine (TYLENOL #3) 300-30 MG per tablet Take 1-2 tablets by mouth every 6 (six) hours as needed for moderate pain. (Patient not taking: Reported on 07/20/2016) 15 tablet 0  . dicyclomine (BENTYL) 20 MG tablet Take 1 tablet (20 mg total) by mouth 2 (two) times daily as needed for spasms. 20 tablet 0  . ibuprofen (ADVIL,MOTRIN) 800 MG tablet Take 1 tablet (800 mg total) by mouth every 8 (eight) hours as needed. (Patient not taking: Reported on 07/20/2016) 30 tablet 0  . metroNIDAZOLE (FLAGYL) 500 MG tablet Take 1 tablet (500 mg total) by mouth 2 (two) times daily. (Patient not taking: Reported on 07/20/2016) 14 tablet 0  . naproxen (NAPROSYN) 500 MG tablet Take 1 tablet (500 mg total) by mouth 2 (two) times daily. 30 tablet 0  . promethazine (PHENERGAN) 25 MG tablet Take 1 tablet (25 mg total) by mouth every 6 (  six) hours as needed for nausea or vomiting. 30 tablet 0  . traMADol (ULTRAM) 50 MG tablet Take 1 tablet (50 mg total) by mouth every 6 (six) hours as needed. 20 tablet 0   No Known Allergies  I have reviewed the past Medical Hx, Surgical Hx, Social Hx, Allergies and Medications.   Review of Systems  Constitutional: Negative for appetite change, chills and fever.  Gastrointestinal: Positive for abdominal pain. Negative for abdominal distention, constipation, diarrhea, nausea and vomiting.  Genitourinary: Positive for pelvic pain. Negative for difficulty urinating, dysuria, flank pain, frequency, hematuria, urgency, vaginal bleeding and vaginal discharge.  Musculoskeletal: Negative for back pain.  Neurological: Negative for dizziness.    OBJECTIVE Patient Vitals for the past 24 hrs:  BP Temp Temp src Pulse Resp SpO2  Height Weight  02/25/17 2153 120/86 98 F (36.7 C) Oral 79 18 100 % 5\' 4"  (1.626 m) 136 lb (61.7 kg)   Constitutional: Well-developed, well-nourished female in no acute distress.  Cardiovascular: normal rate Respiratory: normal rate and effort.  GI: Abd soft, non-tender. Pos BS x 4 MS: Extremities nontender, no edema, normal ROM Neurologic: Alert and oriented x 4.  GU: Neg CVAT.  SPECULUM EXAM: NEFG, physiologic discharge, no blood noted, cervix clean  BIMANUAL: cervix long and closed; uterus top-normal size, no adnexal tenderness or masses. No CMT.  LAB RESULTS Results for orders placed or performed during the hospital encounter of 02/25/17 (from the past 24 hour(s))  Urinalysis, Routine w reflex microscopic     Status: Abnormal   Collection Time: 02/25/17  9:55 PM  Result Value Ref Range   Color, Urine YELLOW YELLOW   APPearance HAZY (A) CLEAR   Specific Gravity, Urine 1.025 1.005 - 1.030   pH 6.0 5.0 - 8.0   Glucose, UA NEGATIVE NEGATIVE mg/dL   Hgb urine dipstick NEGATIVE NEGATIVE   Bilirubin Urine NEGATIVE NEGATIVE   Ketones, ur NEGATIVE NEGATIVE mg/dL   Protein, ur NEGATIVE NEGATIVE mg/dL   Nitrite NEGATIVE NEGATIVE   Leukocytes, UA NEGATIVE NEGATIVE  Pregnancy, urine POC     Status: Abnormal   Collection Time: 02/25/17 10:20 PM  Result Value Ref Range   Preg Test, Ur POSITIVE (A) NEGATIVE  hCG, quantitative, pregnancy     Status: Abnormal   Collection Time: 02/25/17 11:25 PM  Result Value Ref Range   hCG, Beta Chain, Quant, S 5,401 (H) <5 mIU/mL  CBC     Status: None   Collection Time: 02/25/17 11:25 PM  Result Value Ref Range   WBC 9.6 4.0 - 10.5 K/uL   RBC 4.46 3.87 - 5.11 MIL/uL   Hemoglobin 13.5 12.0 - 15.0 g/dL   HCT 35.4 56.2 - 56.3 %   MCV 88.3 78.0 - 100.0 fL   MCH 30.3 26.0 - 34.0 pg   MCHC 34.3 30.0 - 36.0 g/dL   RDW 89.3 73.4 - 28.7 %   Platelets 229 150 - 400 K/uL    IMAGING US Ob Comp Less 14 Wks  Result Date: 02/26/2017 CLINICAL DATA:  Acute  onset of generalized abdominal pain. Initial encounter. EXAM: OBSTETRIC <14 WK [place] AND TRANSVAGINAL OB US TECHNIQUE: Both transabdominal and transvaginal ultrasound examinations were performed for complete evaluation of the gestation as well as the maternal uterus, adnexal regions, and pelvic cul-de-sac. Transvaginal technique was performed to assess early pregnancy. COMPARISON:  Pelvic ultrasound performed 10/03/2011 FINDINGS: Intrauterine gestational sac: Single; visualized and normal in shape. Yolk sac:  Yes Embryo:  Question of tiny embryo Cardiac  Activity:  No; too small to characterize Heart Rate: N/A MSD: 7.8  mm   5 w   3  d CRL:  1.1 mm    Too small to further characterize at this time Subchorionic hemorrhage: A small amount of subchorionic hemorrhage is noted. Maternal uterus/adnexae: The uterus is otherwise unremarkable in appearance. The right ovary measures 3.3 x 3.3 x 2.8 cm, while the left ovary measures 3.1 x 2.0 x 2.5 cm. No suspicious adnexal masses are seen; there is no evidence for ovarian torsion. No free fluid is seen within the pelvic cul-de-sac. IMPRESSION: 1. Single intrauterine gestational sac noted, with a mean sac diameter of 8 mm, corresponding to a gestational age of [redacted] weeks 3 days. This does not match the gestational age by LMP, though it remains too early to determine a new estimated date of delivery. A yolk sac is visualized. A tiny embryo is suggested, though it is too small to further characterize at this time. Would perform follow-up pelvic ultrasound in 2 weeks, if quantitative beta HCG continues to rise. 2. Small amount of subchorionic hemorrhage noted. Electronically Signed   By: Roanna Raider M.D.   On: 02/26/2017 00:36   US Ob Transvaginal  Result Date: 02/26/2017 CLINICAL DATA:  Acute onset of generalized abdominal pain. Initial encounter. EXAM: OBSTETRIC <14 WK [place] AND TRANSVAGINAL OB US TECHNIQUE: Both transabdominal and transvaginal ultrasound examinations were  performed for complete evaluation of the gestation as well as the maternal uterus, adnexal regions, and pelvic cul-de-sac. Transvaginal technique was performed to assess early pregnancy. COMPARISON:  Pelvic ultrasound performed 10/03/2011 FINDINGS: Intrauterine gestational sac: Single; visualized and normal in shape. Yolk sac:  Yes Embryo:  Question of tiny embryo Cardiac Activity:  No; too small to characterize Heart Rate: N/A MSD: 7.8  mm   5 w   3  d CRL:  1.1 mm    Too small to further characterize at this time Subchorionic hemorrhage: A small amount of subchorionic hemorrhage is noted. Maternal uterus/adnexae: The uterus is otherwise unremarkable in appearance. The right ovary measures 3.3 x 3.3 x 2.8 cm, while the left ovary measures 3.1 x 2.0 x 2.5 cm. No suspicious adnexal masses are seen; there is no evidence for ovarian torsion. No free fluid is seen within the pelvic cul-de-sac. IMPRESSION: 1. Single intrauterine gestational sac noted, with a mean sac diameter of 8 mm, corresponding to a gestational age of [redacted] weeks 3 days. This does not match the gestational age by LMP, though it remains too early to determine a new estimated date of delivery. A yolk sac is visualized. A tiny embryo is suggested, though it is too small to further characterize at this time. Would perform follow-up pelvic ultrasound in 2 weeks, if quantitative beta HCG continues to rise. 2. Small amount of subchorionic hemorrhage noted. Electronically Signed   By: Roanna Raider M.D.   On: 02/26/2017 00:36    MAU COURSE CBC, Quant, ABO/Rh, ultrasound, wet prep and GC/chlamydia culture, UA  MDM And pain in early pregnancy with normal intrauterine pregnancy and hemodynamically stable. Viability and dating not confirmed.   ASSESSMENT 1. Normal IUP (intrauterine pregnancy) on prenatal ultrasound, first trimester   2. Abdominal pain affecting pregnancy     PLAN Discharge home in stable condition. First trimester  precautions. Pregnancy verification letter given.  Rx PNV. Follow-up Information    [facility] Follow up.   Why:  start prenatal care Contact information: [address] [place]  [place] [address] [phone]        [facility] [place] MATERNITY ADMISSIONS Follow up.   Why:  in pregnancy emergencies Contact information: [address] [id number] mc [place] [address] [phone]         Allergies as of 02/26/2017   No Known Allergies     Medication List    STOP taking these medications   acetaminophen-codeine 300-30 MG tablet Commonly known as:  TYLENOL #3   dicyclomine 20 MG tablet Commonly known as:  BENTYL   ibuprofen 800 MG tablet Commonly known as:  ADVIL,MOTRIN   metroNIDAZOLE 500 MG tablet Commonly known as:  FLAGYL   naproxen 500 MG tablet Commonly known as:  NAPROSYN     TAKE these medications   CONCEPT OB 130-92.4-1 MG Caps Take 1 tablet by mouth daily.   promethazine 25 MG tablet Commonly known as:  PHENERGAN Take 1 tablet (25 mg total) by mouth every 6 (six) hours as needed for nausea or vomiting.   traMADol 50 MG tablet Commonly known as:  ULTRAM Take 1 tablet (50 mg total) by mouth every 6 (six) hours as needed.        [place], CNM 02/26/2017  1:36 AM  4

## 2017-02-25 NOTE — MAU Note (Signed)
Pt here with abdominal and back pain since Saturday, worsening today. Had + pregnancy at home and at hospital. Had [place] but not able to see yolk sac yet.

## 2017-02-26 DIAGNOSIS — R109 Unspecified abdominal pain: Secondary | ICD-10-CM

## 2017-02-26 DIAGNOSIS — O9989 Other specified diseases and conditions complicating pregnancy, childbirth and the puerperium: Secondary | ICD-10-CM

## 2017-02-26 LAB — HIV ANTIBODY (ROUTINE TESTING W REFLEX): HIV Screen 4th Generation wRfx: NONREACTIVE

## 2017-02-26 LAB — HCG, QUANTITATIVE, PREGNANCY: hCG, Beta Chain, Quant, S: 5401 m[IU]/mL — ABNORMAL HIGH (ref <5)

## 2017-02-26 MED ORDER — CONCEPT OB 130-92.4-1 MG PO CAPS: 1.0000 | ORAL_CAPSULE | Freq: Every day | ORAL | 12 refills | Status: DC

## 2017-02-26 NOTE — Discharge Instructions (Signed)
Abdominal Pain During Pregnancy Abdominal pain is common in pregnancy. Most of the time, it does not cause harm. There are many causes of abdominal pain. Some causes are more serious than others and sometimes the cause is not known. Abdominal pain can be a sign that something is very wrong with the pregnancy or the pain may have nothing to do with the pregnancy. Always tell your health care provider if you have any abdominal pain. Follow these instructions at home:  Do not have sex or put anything in your vagina until your symptoms go away completely.  Watch your abdominal pain for any changes.  Get plenty of rest until your pain improves.  Drink enough fluid to keep your urine clear or pale yellow.  Take over-the-counter or prescription medicines only as told by your health care provider.  Keep all follow-up visits as told by your health care provider. This is important. Contact a health care provider if:  You have a fever.  Your pain gets worse or you have cramping.  Your pain continues after resting. Get help right away if:  You are bleeding, leaking fluid, or passing tissue from the vagina.  You have vomiting or diarrhea that does not go away.  You have painful or bloody urination.  You notice a decrease in your baby's movements.  You feel very weak or faint.  You have shortness of breath.  You develop a severe headache with abdominal pain.  You have abnormal vaginal discharge with abdominal pain. This information is not intended to replace advice given to you by your health care provider. Make sure you discuss any questions you have with your health care provider. Document Released: 10/21/2005 Document Revised: 08/01/2016 Document Reviewed: 05/20/2013 Elsevier Interactive Patient Education  2017 [employer]   Prenatal Care WHAT IS PRENATAL CARE? Prenatal care is the process of caring for a pregnant woman before she gives birth. Prenatal care makes sure that she  and her baby remain as healthy as possible throughout pregnancy. Prenatal care may be provided by a midwife, family practice health care provider, or a childbirth and pregnancy specialist (obstetrician). Prenatal care may include physical examinations, testing, treatments, and education on nutrition, lifestyle, and social support services. WHY IS PRENATAL CARE SO IMPORTANT? Early and consistent prenatal care increases the chance that you and your baby will remain healthy throughout your pregnancy. This type of care also decreases a babys risk of being born too early (prematurely), or being born smaller than expected (small for gestational age). Any underlying medical conditions you may have that could pose a risk during your pregnancy are discussed during prenatal care visits. You will also be monitored regularly for any new conditions that may arise during your pregnancy so they can be treated quickly and effectively. WHAT HAPPENS DURING PRENATAL CARE VISITS? Prenatal care visits may include the following: Discussion Tell your health care provider about any new signs or symptoms you have experienced since your last visit. These might include:  Nausea or vomiting.  Increased or decreased level of energy.  Difficulty sleeping.  Back or leg pain.  Weight changes.  Frequent urination.  Shortness of breath with physical activity.  Changes in your skin, such as the development of a rash or itchiness.  Vaginal discharge or bleeding.  Feelings of excitement or nervousness.  Changes in your babys movements. You may want to write down any questions or topics you want to discuss with your health care provider and bring them with you to your  appointment. Examination During your first prenatal care visit, you will likely have a complete physical exam. Your health care provider will often examine your vagina, cervix, and the position of your uterus, as well as check your heart, lungs, and other  body systems. As your pregnancy progresses, your health care provider will measure the size of your uterus and your babys position inside your uterus. He or she may also examine you for early signs of labor. Your prenatal visits may also include checking your blood pressure and, after about 10-12 weeks of pregnancy, listening to your babys heartbeat. Testing Regular testing often includes:  Urinalysis. This checks your urine for glucose, protein, or signs of infection.  Blood count. This checks the levels of white and red blood cells in your body.  Tests for sexually transmitted infections (STIs). Testing for STIs at the beginning of pregnancy is routinely done and is required in many states.  Antibody testing. You will be checked to see if you are immune to certain illnesses, such as rubella, that can affect a developing fetus.  Glucose screen. Around 24-28 weeks of pregnancy, your blood glucose level will be checked for signs of gestational diabetes. Follow-up tests may be recommended.  Group B strep. This is a bacteria that is commonly found inside a womans vagina. This test will inform your health care provider if you need an antibiotic to reduce the amount of this bacteria in your body prior to labor and childbirth.  Ultrasound. Many pregnant women undergo an ultrasound screening around 18-20 weeks of pregnancy to evaluate the health of the fetus and check for any developmental abnormalities.  HIV (human immunodeficiency virus) testing. Early in your pregnancy, you will be screened for HIV. If you are at high risk for HIV, this test may be repeated during your third trimester of pregnancy. You may be offered other testing based on your age, personal or family medical history, or other factors. HOW OFTEN SHOULD I PLAN TO SEE MY HEALTH CARE PROVIDER FOR PRENATAL CARE? Your prenatal care check-up schedule depends on any medical conditions you have before, or develop during, your pregnancy.  If you do not have any underlying medical conditions, you will likely be seen for checkups:  Monthly, during the first 6 months of pregnancy.  Twice a month during months 7 and 8 of pregnancy.  Weekly starting in the 9th month of pregnancy and until delivery. If you develop signs of early labor or other concerning signs or symptoms, you may need to see your health care provider more often. Ask your health care provider what prenatal care schedule is best for you. WHAT CAN I DO TO KEEP MYSELF AND MY BABY AS HEALTHY AS POSSIBLE DURING MY PREGNANCY?  Take a prenatal vitamin containing 400 micrograms (0.4 mg) of folic acid every day. Your health care provider may also ask you to take additional vitamins such as iodine, vitamin D, iron, copper, and zinc.  Take 1500-2000 mg of calcium daily starting at your 20th week of pregnancy until you deliver your baby.  Make sure you are up to date on your vaccinations. Unless directed otherwise by your health care provider:  You should receive a tetanus, diphtheria, and pertussis (Tdap) vaccination between the 27th and 36th week of your pregnancy, regardless of when your last Tdap immunization occurred. This helps protect your baby from whooping cough (pertussis) after he or she is born.  You should receive an annual inactivated influenza vaccine (IIV) to help protect you and your  baby from influenza. This can be done at any point during your pregnancy.  Eat a well-rounded diet that includes:  Fresh fruits and vegetables.  Lean proteins.  Calcium-rich foods such as milk, yogurt, hard cheeses, and dark, leafy greens.  Whole grain breads.  Do noteat seafood high in mercury, including:  Swordfish.  Tilefish.  Shark.  King mackerel.  More than 6 oz tuna per week.  Do not eat:  Raw or undercooked meats or eggs.  Unpasteurized foods, such as soft cheeses (brie, blue, or feta), juices, and milks.  Lunch meats.  Hot dogs that have not been  heated until they are steaming.  Drink enough water to keep your urine clear or pale yellow. For many women, this may be 10 or more 8 oz glasses of water each day. Keeping yourself hydrated helps deliver nutrients to your baby and may prevent the start of pre-term uterine contractions.  Do not use any tobacco products including cigarettes, chewing tobacco, or electronic cigarettes. If you need help quitting, ask your health care provider.  Do not drink beverages containing alcohol. No safe level of alcohol consumption during pregnancy has been determined.  Do not use any illegal drugs. These can harm your developing baby or cause a miscarriage.  Ask your health care provider or pharmacist before taking any prescription or over-the-counter medicines, herbs, or supplements.  Limit your caffeine intake to no more than 200 mg per day.  Exercise. Unless told otherwise by your health care provider, try to get 30 minutes of moderate exercise most days of the week. Do not  do high-impact activities, contact sports, or activities with a high risk of falling, such as horseback riding or downhill skiing.  Get plenty of rest.  Avoid anything that raises your body temperature, such as hot tubs and saunas.  If you own a cat, do not empty its litter box. Bacteria contained in cat feces can cause an infection called toxoplasmosis. This can result in serious harm to the fetus.  Stay away from chemicals such as insecticides, lead, mercury, and cleaning or paint products that contain solvents.  Do not have any X-rays taken unless medically necessary.  Take a childbirth and breastfeeding preparation class. Ask your health care provider if you need a referral or recommendation. This information is not intended to replace advice given to you by your health care provider. Make sure you discuss any questions you have with your health care provider. Document Released: 10/24/2003 Document Revised: 03/25/2016  Document Reviewed: 01/05/2014 Elsevier Interactive Patient Education  2017 [employer].

## 2017-03-19 ENCOUNTER — Ambulatory Visit (INDEPENDENT_AMBULATORY_CARE_PROVIDER_SITE_OTHER): Payer: Medicaid Other | Admitting: Adult Health

## 2017-03-19 ENCOUNTER — Encounter: Payer: Self-pay | Admitting: Obstetrics and Gynecology

## 2017-03-19 VITALS — BP 100/68 | HR 90 | Ht 64.0 in | Wt 135.0 lb

## 2017-03-19 DIAGNOSIS — Z1389 Encounter for screening for other disorder: Secondary | ICD-10-CM

## 2017-03-19 DIAGNOSIS — O208 Other hemorrhage in early pregnancy: Secondary | ICD-10-CM

## 2017-03-19 DIAGNOSIS — Z331 Pregnant state, incidental: Secondary | ICD-10-CM | POA: Diagnosis not present

## 2017-03-19 DIAGNOSIS — O209 Hemorrhage in early pregnancy, unspecified: Secondary | ICD-10-CM

## 2017-03-19 DIAGNOSIS — O200 Threatened abortion: Secondary | ICD-10-CM | POA: Diagnosis not present

## 2017-03-19 DIAGNOSIS — O3680X0 Pregnancy with inconclusive fetal viability, not applicable or unspecified: Secondary | ICD-10-CM

## 2017-03-19 LAB — POCT URINALYSIS DIPSTICK
GLUCOSE UA: NEGATIVE
Ketones, UA: NEGATIVE
Leukocytes, UA: NEGATIVE
NITRITE UA: NEGATIVE
Protein, UA: NEGATIVE
RBC UA: NEGATIVE

## 2017-03-19 NOTE — Patient Instructions (Signed)
Pelvic rest  F/U 1 week for [place]  First Trimester of Pregnancy The first trimester of pregnancy is from week 1 until the end of week 13 (months 1 through 3). A week after a sperm fertilizes an egg, the egg will implant on the wall of the uterus. This embryo will begin to develop into a baby. Genes from you and your partner will form the baby. The female genes will determine whether the baby will be a boy or a girl. At 6-8 weeks, the eyes and face will be formed, and the heartbeat can be seen on ultrasound. At the end of 12 weeks, all the baby's organs will be formed. Now that you are pregnant, you will want to do everything you can to have a healthy baby. Two of the most important things are to get good prenatal care and to follow your health care provider's instructions. Prenatal care is all the medical care you receive before the baby's birth. This care will help prevent, find, and treat any problems during the pregnancy and childbirth. Body changes during your first trimester Your body goes through many changes during pregnancy. The changes vary from woman to woman.  You may gain or lose a couple of pounds at first.  You may feel sick to your stomach (nauseous) and you may throw up (vomit). If the vomiting is uncontrollable, call your health care provider.  You may tire easily.  You may develop headaches that can be relieved by medicines. All medicines should be approved by your health care provider.  You may urinate more often. Painful urination may mean you have a bladder infection.  You may develop heartburn as a result of your pregnancy.  You may develop constipation because certain hormones are causing the muscles that push stool through your intestines to slow down.  You may develop hemorrhoids or swollen veins (varicose veins).  Your breasts may begin to grow larger and become tender. Your nipples may stick out more, and the tissue that surrounds them (areola) may become darker.  Your  gums may bleed and may be sensitive to brushing and flossing.  Dark spots or blotches (chloasma, mask of pregnancy) may develop on your face. This will likely fade after the baby is born.  Your menstrual periods will stop.  You may have a loss of appetite.  You may develop cravings for certain kinds of food.  You may have changes in your emotions from day to day, such as being excited to be pregnant or being concerned that something may go wrong with the pregnancy and baby.  You may have more vivid and strange dreams.  You may have changes in your hair. These can include thickening of your hair, rapid growth, and changes in texture. Some women also have hair loss during or after pregnancy, or hair that feels dry or thin. Your hair will most likely return to normal after your baby is born. What to expect at prenatal visits During a routine prenatal visit:  You will be weighed to make sure you and the baby are growing normally.  Your blood pressure will be taken.  Your abdomen will be measured to track your baby's growth.  The fetal heartbeat will be listened to between weeks 10 and 14 of your pregnancy.  Test results from any previous visits will be discussed. Your health care provider may ask you:  How you are feeling.  If you are feeling the baby move.  If you have had any abnormal symptoms,  such as leaking fluid, bleeding, severe headaches, or abdominal cramping.  If you are using any tobacco products, including cigarettes, chewing tobacco, and electronic cigarettes.  If you have any questions. Other tests that may be performed during your first trimester include:  Blood tests to find your blood type and to check for the presence of any previous infections. The tests will also be used to check for low iron levels (anemia) and protein on red blood cells (Rh antibodies). Depending on your risk factors, or if you previously had diabetes during pregnancy, you may have tests to  check for high blood sugar that affects pregnant women (gestational diabetes).  Urine tests to check for infections, diabetes, or protein in the urine.  An ultrasound to confirm the proper growth and development of the baby.  Fetal screens for spinal cord problems (spina bifida) and Down syndrome.  HIV (human immunodeficiency virus) testing. Routine prenatal testing includes screening for HIV, unless you choose not to have this test.  You may need other tests to make sure you and the baby are doing well. Follow these instructions at home: Medicines   Follow your health care provider's instructions regarding medicine use. Specific medicines may be either safe or unsafe to take during pregnancy.  Take a prenatal vitamin that contains at least 600 micrograms (mcg) of folic acid.  If you develop constipation, try taking a stool softener if your health care provider approves. Eating and drinking   Eat a balanced diet that includes fresh fruits and vegetables, whole grains, good sources of protein such as meat, eggs, or tofu, and low-fat dairy. Your health care provider will help you determine the amount of weight gain that is right for you.  Avoid raw meat and uncooked cheese. These carry germs that can cause birth defects in the baby.  Eating four or five small meals rather than three large meals a day may help relieve nausea and vomiting. If you start to feel nauseous, eating a few soda crackers can be helpful. Drinking liquids between meals, instead of during meals, also seems to help ease nausea and vomiting.  Limit foods that are high in fat and processed sugars, such as fried and sweet foods.  To prevent constipation:  Eat foods that are high in fiber, such as fresh fruits and vegetables, whole grains, and beans.  Drink enough fluid to keep your urine clear or pale yellow. Activity   Exercise only as directed by your health care provider. Most women can continue their usual  exercise routine during pregnancy. Try to exercise for 30 minutes at least 5 days a week. Exercising will help you:  Control your weight.  Stay in shape.  Be prepared for labor and delivery.  Experiencing pain or cramping in the lower abdomen or lower back is a good sign that you should stop exercising. Check with your health care provider before continuing with normal exercises.  Try to avoid standing for long periods of time. Move your legs often if you must stand in one place for a long time.  Avoid heavy lifting.  Wear low-heeled shoes and practice good posture.  You may continue to have sex unless your health care provider tells you not to. Relieving pain and discomfort   Wear a good support bra to relieve breast tenderness.  Take warm sitz baths to soothe any pain or discomfort caused by hemorrhoids. Use hemorrhoid cream if your health care provider approves.  Rest with your legs elevated if you have leg  cramps or low back pain.  If you develop varicose veins in your legs, wear support hose. Elevate your feet for 15 minutes, 3-4 times a day. Limit salt in your diet. Prenatal care   Schedule your prenatal visits by the twelfth week of pregnancy. They are usually scheduled monthly at first, then more often in the last 2 months before delivery.  Write down your questions. Take them to your prenatal visits.  Keep all your prenatal visits as told by your health care provider. This is important. Safety   Wear your seat belt at all times when driving.  Make a list of emergency phone numbers, including numbers for family, friends, the hospital, and police and fire departments. General instructions   Ask your health care provider for a referral to a local prenatal education class. Begin classes no later than the beginning of month 6 of your pregnancy.  Ask for help if you have counseling or nutritional needs during pregnancy. Your health care provider can offer advice or refer  you to specialists for help with various needs.  Do not use hot tubs, steam rooms, or saunas.  Do not douche or use tampons or scented sanitary pads.  Do not cross your legs for long periods of time.  Avoid cat litter boxes and soil used by cats. These carry germs that can cause birth defects in the baby and possibly loss of the fetus by miscarriage or stillbirth.  Avoid all smoking, herbs, alcohol, and medicines not prescribed by your health care provider. Chemicals in these products affect the formation and growth of the baby.  Do not use any products that contain nicotine or tobacco, such as cigarettes and e-cigarettes. If you need help quitting, ask your health care provider. You may receive counseling support and other resources to help you quit.  Schedule a dentist appointment. At home, brush your teeth with a soft toothbrush and be gentle when you floss. Contact a health care provider if:  You have dizziness.  You have mild pelvic cramps, pelvic pressure, or nagging pain in the abdominal area.  You have persistent nausea, vomiting, or diarrhea.  You have a bad smelling vaginal discharge.  You have pain when you urinate.  You notice increased swelling in your face, hands, legs, or ankles.  You are exposed to fifth disease or chickenpox.  You are exposed to [place] measles (rubella) and have never had it. Get help right away if:  You have a fever.  You are leaking fluid from your vagina.  You have spotting or bleeding from your vagina.  You have severe abdominal cramping or pain.  You have rapid weight gain or loss.  You vomit blood or material that looks like coffee grounds.  You develop a severe headache.  You have shortness of breath.  You have any kind of trauma, such as from a fall or a car accident. Summary  The first trimester of pregnancy is from week 1 until the end of week 13 (months 1 through 3).  Your body goes through many changes during  pregnancy. The changes vary from woman to woman.  You will have routine prenatal visits. During those visits, your health care provider will examine you, discuss any test results you may have, and talk with you about how you are feeling. This information is not intended to replace advice given to you by your health care provider. Make sure you discuss any questions you have with your health care provider. Document Released: 10/15/2001 Document  Revised: 10/02/2016 Document Reviewed: 10/02/2016 Elsevier Interactive Patient Education  2017 [employer].

## 2017-03-19 NOTE — Progress Notes (Signed)
Subjective:     Patient ID: Victoria Soto, female   DOB: 09-01-1986, 31 y.o.   MRN: [id number]  HPI Victoria Soto is a 31 year old white female, married in for follow up of going to ER at [facility] for vaginal bleeding, with some cramping.  Review of Systems Vaginal bleeding and some cramps Reviewed past medical,surgical, social and family history. Reviewed medications and allergies.     Objective:   Physical Exam BP 100/68 (BP Location: Right Arm, Patient Position: Sitting, Cuff Size: Normal)   Pulse 90   Ht 5\' 4"  (1.626 m)   Wt 135 lb (61.2 kg)   LMP 12/28/2016   BMI 23.17 kg/m urine dipstick negative, Skin warm and dry. Neck: mid line trachea, normal thyroid, good ROM, no lymphadenopathy noted. Lungs: clear to ausculation bilaterally. Cardiovascular: regular rate and rhythm.Abdomen is soft and non tender. Q[facility]CG 3057 at [facility] 5401 on 02/25/17 at [facility] and  was 16,664 on 03/18/17, at [facility] and [place] on 5/15 showed IUP of 6+2 weeks and FHR 89, no SCH seen. Blood type O+. Discussed may not end well, but can be optimistic for now.Will recheck QHCG in am and progesterone level.Pelvic rest, no sex or heavy lifting.will supplement if progesterone low   Face time 25 minutes with 50% counseling and coordinating care.  Assessment:     1. Vaginal bleeding affecting early pregnancy   2. Threatened abortion   3. Pregnant state, incidental   4. Screening for genitourinary condition   5. Encounter to determine fetal viability of pregnancy, single or unspecified fetus       Plan:     Check QHCG and progesterone in am Return in 1 week for F/U US Pelvic rest Review handout on first trimester

## 2017-03-20 ENCOUNTER — Inpatient Hospital Stay (HOSPITAL_COMMUNITY): Payer: Medicaid Other

## 2017-03-20 ENCOUNTER — Inpatient Hospital Stay (HOSPITAL_COMMUNITY)
Admission: AD | Admit: 2017-03-20 | Discharge: 2017-03-20 | Disposition: A | Discharge status: Home / Self Care | Payer: Medicaid Other | Source: Ambulatory Visit | Attending: Obstetrics & Gynecology | Admitting: Obstetrics & Gynecology

## 2017-03-20 ENCOUNTER — Encounter (HOSPITAL_COMMUNITY): Payer: Self-pay | Admitting: *Deleted

## 2017-03-20 DIAGNOSIS — Z8249 Family history of ischemic heart disease and other diseases of the circulatory system: Secondary | ICD-10-CM | POA: Insufficient documentation

## 2017-03-20 DIAGNOSIS — O209 Hemorrhage in early pregnancy, unspecified: Secondary | ICD-10-CM

## 2017-03-20 DIAGNOSIS — Z833 Family history of diabetes mellitus: Secondary | ICD-10-CM | POA: Diagnosis not present

## 2017-03-20 DIAGNOSIS — Z87891 Personal history of nicotine dependence: Secondary | ICD-10-CM | POA: Diagnosis not present

## 2017-03-20 DIAGNOSIS — Z3A01 Less than 8 weeks gestation of pregnancy: Secondary | ICD-10-CM | POA: Diagnosis not present

## 2017-03-20 DIAGNOSIS — O021 Missed abortion: Secondary | ICD-10-CM | POA: Diagnosis not present

## 2017-03-20 DIAGNOSIS — N939 Abnormal uterine and vaginal bleeding, unspecified: Secondary | ICD-10-CM | POA: Diagnosis present

## 2017-03-20 LAB — URINALYSIS, ROUTINE W REFLEX MICROSCOPIC
BILIRUBIN URINE: NEGATIVE
Bacteria, UA: NONE SEEN
Glucose, UA: NEGATIVE mg/dL
KETONES UR: NEGATIVE mg/dL
Nitrite: NEGATIVE
PH: 6 (ref 5.0–8.0)
PROTEIN: NEGATIVE mg/dL
Specific Gravity, Urine: 1.025 (ref 1.005–1.030)

## 2017-03-20 LAB — CBC
HEMATOCRIT: 44.5 % (ref 36.0–46.0)
HEMOGLOBIN: 15 g/dL (ref 12.0–15.0)
MCH: 30 pg (ref 26.0–34.0)
MCHC: 33.7 g/dL (ref 30.0–36.0)
MCV: 89 fL (ref 78.0–100.0)
PLATELETS: 254 10*3/uL (ref 150–400)
RBC: 5 MIL/uL (ref 3.87–5.11)
RDW: 12.5 % (ref 11.5–15.5)
WBC: 10.9 10*3/uL — AB (ref 4.0–10.5)

## 2017-03-20 MED ORDER — OXYCODONE-ACETAMINOPHEN 5-325 MG PO TABS: 1.0000 | ORAL_TABLET | ORAL | 0 refills | Status: DC | PRN

## 2017-03-20 NOTE — MAU Note (Signed)
Started having brown bleeding earlier this week. Went to [facility] hospital. Had an u/s showed a fetal HR of 86 and baby only measuring 6 weeks . Patient supposed to be 8 weeks. Hd subchorionic hemorrhage  on ealier U/S. Went to [facility] today. Did f/u BHCG today and u/s scheduled for next week. Bleeding got more red today and thinks some tissue came out. Having some increased cramping  That has gotten less over the pas few hours.

## 2017-03-20 NOTE — MAU Note (Signed)
Asked pt if she wanted to talk to spiritual care and she denied at this time. Pt asked for numbers for the future and they were provided. Pt has no further questions. Heart strings packet given.

## 2017-03-20 NOTE — Discharge Instructions (Signed)

## 2017-03-20 NOTE — MAU Provider Note (Signed)
History     CSN: [id number]  Arrival date and time: 03/20/17 1616   First Provider Initiated Contact with Patient 03/20/17 1936      Chief Complaint  Patient presents with  . Vaginal Bleeding   HPI Victoria Soto is a 31 y.o. [phone] at [redacted]w[redacted]d who presents with vaginal bleeding & abdominal cramping. Was seen at [facility] 2 days ago & had ultrasound that showed IUP with heart rate of 86.  Reports brown spotting earlier this week that has increased today and turned bright red. Is not saturating pads but has passed 2 large clots. Lower abdominal cramping is intermittent. Rates pain 7/10. Has not treated.   OB History    Gravida Para Term Preterm AB Living   4 2 2  0 1 2   SAB TAB Ectopic Multiple Live Births   1 0 0 0 2      Past Medical History:  Diagnosis Date  . Depression   . Headache(784.0)   . No pertinent past medical history     Past Surgical History:  Procedure Laterality Date  . NO PAST SURGERIES      Family History  Problem Relation Age of Onset  . Muscular dystrophy Other   . Hypertension Mother   . Diabetes Father   . Anesthesia problems Neg Hx     Social History  Substance Use Topics  . Smoking status: Former Smoker    Packs/day: 0.50    Years: 10.00    Types: Cigarettes    Quit date: 02/18/2017  . Smokeless tobacco: Never Used  . Alcohol use No    Allergies: No Known Allergies  Prescriptions Prior to Admission  Medication Sig Dispense Refill Last Dose  . Prenat w/o A Vit-FeFum-FePo-FA (CONCEPT OB) 130-92.4-1 MG CAPS Take 1 tablet by mouth daily. 30 capsule 12 03/19/2017 at Unknown time  . promethazine (PHENERGAN) 25 MG tablet Take 1 tablet (25 mg total) by mouth every 6 (six) hours as needed for nausea or vomiting. (Patient not taking: Reported on 03/19/2017) 30 tablet 0 Not Taking    Review of Systems  Constitutional: Negative.   Gastrointestinal: Positive for abdominal pain. Negative for constipation, diarrhea, nausea and vomiting.   Genitourinary: Positive for vaginal bleeding.   Physical Exam   Blood pressure 97/72, pulse 81, temperature 98.6 F (37 C), temperature source Oral, resp. rate 17, height 5\' 4"  (1.626 m), weight 136 lb (61.7 kg), last menstrual period 12/28/2016.  Physical Exam  Nursing note and vitals reviewed. Constitutional: She is oriented to person, place, and time. She appears well-developed and well-nourished. No distress.  HENT:  Head: Normocephalic and atraumatic.  Eyes: Conjunctivae are normal. Right eye exhibits no discharge. Left eye exhibits no discharge. No scleral icterus.  Neck: Normal range of motion.  Respiratory: Effort normal. No respiratory distress.  GI: Soft. There is no tenderness.  Genitourinary: There is bleeding (small amount of dark red blood; no active bleeding; cervix closed) in the vagina.  Neurological: She is alert and oriented to person, place, and time.  Skin: Skin is warm and dry. She is not diaphoretic.  Psychiatric: She has a normal mood and affect. Her behavior is normal. Judgment and thought content normal.    MAU Course  Procedures Results for orders placed or performed during the hospital encounter of 03/20/17 (from the past 24 hour(s))  CBC     Status: Abnormal   Collection Time: 03/20/17  5:41 PM  Result Value Ref Range   WBC 10.9 (  H) 4.0 - 10.5 K/uL   RBC 5.00 3.87 - 5.11 MIL/uL   Hemoglobin 15.0 12.0 - 15.0 g/dL   HCT 43.1 54.0 - 08.6 %   MCV 89.0 78.0 - 100.0 fL   MCH 30.0 26.0 - 34.0 pg   MCHC 33.7 30.0 - 36.0 g/dL   RDW 76.1 95.0 - 93.2 %   Platelets 254 150 - 400 K/uL  Urinalysis, Routine w reflex microscopic     Status: Abnormal   Collection Time: 03/20/17  6:45 PM  Result Value Ref Range   Color, Urine YELLOW YELLOW   APPearance CLEAR CLEAR   Specific Gravity, Urine 1.025 1.005 - 1.030   pH 6.0 5.0 - 8.0   Glucose, UA NEGATIVE NEGATIVE mg/dL   Hgb urine dipstick LARGE (A) NEGATIVE   Bilirubin Urine NEGATIVE NEGATIVE   Ketones, ur  NEGATIVE NEGATIVE mg/dL   Protein, ur NEGATIVE NEGATIVE mg/dL   Nitrite NEGATIVE NEGATIVE   Leukocytes, UA TRACE (A) NEGATIVE   RBC / HPF 6-30 0 - 5 RBC/hpf   WBC, UA 0-5 0 - 5 WBC/hpf   Bacteria, UA NONE SEEN NONE SEEN   Squamous Epithelial / LPF 0-5 (A) NONE SEEN   Mucous PRESENT    US Ob Transvaginal  Result Date: 03/20/2017 CLINICAL DATA:  Vaginal bleeding with cramping EXAM: TRANSVAGINAL OB ULTRASOUND TECHNIQUE: Transvaginal ultrasound was performed for complete evaluation of the gestation as well as the maternal uterus, adnexal regions, and pelvic cul-de-sac. COMPARISON:  02/25/2017 FINDINGS: Intrauterine gestational sac: Single intrauterine gestation. Yolk sac:  Visualized Embryo:  Visualized Cardiac Activity: Not visualized CRL:   6  mm   6 w to d                  [place] EDC: 11/11/2017 Subchorionic hemorrhage:  None visualized. Maternal uterus/adnexae: The ovaries are within normal limits. Left ovary measures 2.3 x 3 x 2.8 cm. The right ovary measures 3 x 1.9 by 1.9 cm. No free fluid. IMPRESSION: Single intrauterine pregnancy. Fetal pole definitively visualized with average crown-rump length of 6 mm, however no fetal cardiac activity visualized. Findings are suspicious but not yet definitive for failed pregnancy. Recommend follow-up US in 10-14 days for definitive diagnosis. This recommendation follows SRU consensus guidelines: Diagnostic Criteria for Nonviable Pregnancy Early in the First Trimester. Malva Limes Med 2013; 671:2458-09. Electronically Signed   By: Jasmine Pang M.D.   On: 03/20/2017 20:54     MDM O positive CBC -- hemoglobin stable at 15 Ultrasound -- IUP with no cardiac activity Discussed options for management of incomplete AB including expectant management, Cytotec or D&C. Prefers expectant management at this time. Verbalizes understanding that intervention may become necessary if SAB in not completed spontaneously or if heavy bleeding or infection occur.  Patient has follow  up with [facility]  Assessment and Plan  A; 1. Missed abortion with fetal demise before 20 completed weeks of gestation   2. Vaginal bleeding in pregnancy, first trimester    P; Discharge home Pelvic rest Keep f/u with [facility] Discussed reasons to return to MAU  Judeth Horn 03/20/2017, 7:36 PM

## 2017-03-21 ENCOUNTER — Telehealth: Payer: Self-pay | Admitting: Adult Health

## 2017-03-21 LAB — BETA HCG QUANT (REF LAB): HCG QUANT: 20706 m[IU]/mL

## 2017-03-21 LAB — PROGESTERONE: PROGESTERONE: 2.6 ng/mL

## 2017-03-21 NOTE — Telephone Encounter (Signed)
I called to check on her, was seen at [facility] yesterday for increased bleeding and had no FHM on US,has appt next week for [place].Still spotting but not heavy.

## 2017-03-24 ENCOUNTER — Encounter: Payer: Self-pay | Admitting: Adult Health

## 2017-03-24 ENCOUNTER — Ambulatory Visit (INDEPENDENT_AMBULATORY_CARE_PROVIDER_SITE_OTHER): Payer: Medicaid Other | Admitting: Adult Health

## 2017-03-24 ENCOUNTER — Other Ambulatory Visit: Payer: Self-pay | Admitting: Adult Health

## 2017-03-24 ENCOUNTER — Ambulatory Visit (INDEPENDENT_AMBULATORY_CARE_PROVIDER_SITE_OTHER): Payer: Medicaid Other

## 2017-03-24 VITALS — BP 100/60 | HR 100 | Temp 98.4°F | Ht 64.0 in | Wt 135.0 lb

## 2017-03-24 DIAGNOSIS — O034 Incomplete spontaneous abortion without complication: Secondary | ICD-10-CM

## 2017-03-24 DIAGNOSIS — O3680X0 Pregnancy with inconclusive fetal viability, not applicable or unspecified: Secondary | ICD-10-CM

## 2017-03-24 MED ORDER — OXYCODONE-ACETAMINOPHEN 5-325 MG PO TABS: 1.0000 | ORAL_TABLET | ORAL | 0 refills | Status: DC | PRN

## 2017-03-24 MED ORDER — MISOPROSTOL 200 MCG PO TABS: ORAL_TABLET | ORAL | 0 refills | Status: DC

## 2017-03-24 NOTE — Progress Notes (Signed)
Subjective:     Patient ID: Victoria Soto, female   DOB: 1986-07-04, 31 y.o.   MRN: [id number]  HPI Victoria Soto is s 31 year old white female, married, in for bleeding and pain, ?fever yesterday.Seen Thursday at [facility] for missed abortion.  Review of Systems Bleeding and pain Reviewed past medical,surgical, social and family history. Reviewed medications and allergies.     Objective:   Physical Exam BP 100/60 (BP Location: Left Arm, Patient Position: Sitting, Cuff Size: Normal)   Pulse 100   Temp 98.4 F (36.9 C)   Ht 5\' 4"  (1.626 m)   Wt 135 lb (61.2 kg)   LMP 12/28/2016   BMI 23.17 kg/m  Skin warm and dry.  Lungs: clear to ausculation bilaterally. Cardiovascular: regular rate and rhythm.   US showed EEC: LUS 28.6 mm and fundal area 15.4 mm with color flow, no IUP seen, ovaries normal.Discussed with Dr Emelda Fear, whether Cytotec or D&C best option, he says cytotec should work, will proceed with Cytotec and pt agrees. Blood type O+. Face time 15 minutes with 50 % counseling and coordinating care.   Assessment:     1. Incomplete abortion       Plan:     Meds ordered this encounter  Medications  . misoprostol (CYTOTEC) 200 MCG tablet    Sig: Take 4 po now    Dispense:  4 tablet    Refill:  0    Order Specific Question:   Supervising Provider    Answer:   Despina Hidden, LUTHER H [[id number]]  . oxyCODONE-acetaminophen (PERCOCET/ROXICET) 5-325 MG tablet    Sig: Take 1-2 tablets by mouth every 4 (four) hours as needed.    Dispense:  20 tablet    Refill:  0    Order Specific Question:   Supervising Provider    Answer:   Despina Hidden, LUTHER H [[id number]]  F/U in 3 days No sex or tub baths Note given to husband where he missed work  Review handout on miscarriage Bring any tissue passed to next visit

## 2017-03-24 NOTE — Progress Notes (Signed)
US TV: NO IUP seen, complex thickened endometrium w/color flow,EEC fundal 15.4 mm,LUS 28.6 mm,normal ovaries bilat,no free fluid

## 2017-03-24 NOTE — Patient Instructions (Signed)

## 2017-03-26 ENCOUNTER — Other Ambulatory Visit: Payer: Medicaid Other

## 2017-03-27 ENCOUNTER — Ambulatory Visit (INDEPENDENT_AMBULATORY_CARE_PROVIDER_SITE_OTHER): Payer: Medicaid Other | Admitting: Adult Health

## 2017-03-27 ENCOUNTER — Encounter: Payer: Self-pay | Admitting: Adult Health

## 2017-03-27 VITALS — BP 90/60 | HR 88 | Ht 64.0 in | Wt 137.0 lb

## 2017-03-27 DIAGNOSIS — O039 Complete or unspecified spontaneous abortion without complication: Secondary | ICD-10-CM

## 2017-03-27 MED ORDER — KETOROLAC TROMETHAMINE 10 MG PO TABS: 10.0000 mg | ORAL_TABLET | Freq: Four times a day (QID) | ORAL | 0 refills | Status: DC | PRN

## 2017-03-27 NOTE — Progress Notes (Signed)
Subjective:     Patient ID: Victoria Soto, female   DOB: [birth year], 31 y.o.   MRN: [id number]  HPI Sadae is a 31 year old white female, married back in follow up of taking cytotec, had bleeding and passed clots, still having some cramping.   Review of Systems +bleeding and cramping Reviewed past medical,surgical, social and family history. Reviewed medications and allergies.     Objective:   Physical Exam BP 90/60 (BP Location: Left Arm, Patient Position: Sitting, Cuff Size: Small)   Pulse 88   Ht 5\' 4"  (1.626 m)   Wt 137 lb (62.1 kg)   LMP 12/28/2016   BMI 23.52 kg/m  Skin warm and dry.Pelvic: external genitalia is normal in appearance no lesions, vagina has blood in vault,urethra has no lesions or masses noted, cervix:smooth and bulbous, uterus: normal size, shape and contour, non tender, no masses felt, adnexa: no masses or tenderness noted. Bladder is non tender and no masses felt.[place] at bedside, shows some fluid in uterus, no sac or fetal pole. Will check [facility] today and have F/U with Dr Emelda Fear next week in my absence.Will Rx toradol if needed for cramping.    Assessment:     1. Miscarriage       Plan:     Check QHCG today Meds ordered this encounter  Medications  . ketorolac (TORADOL) 10 MG tablet    Sig: Take 1 tablet (10 mg total) by mouth every 6 (six) hours as needed.    Dispense:  20 tablet    Refill:  0    Order Specific Question:   Supervising Provider    Answer:   Duane Lope H [[id number]]   Follow up in 1 week with Dr Emelda Fear

## 2017-03-28 ENCOUNTER — Telehealth: Payer: Self-pay | Admitting: Adult Health

## 2017-03-28 LAB — BETA HCG QUANT (REF LAB): hCG Quant: 5244 m[IU]/mL

## 2017-03-28 NOTE — Telephone Encounter (Signed)
Spoke with Victoria Soto letting her know quant was down to 5,244. It has dropped considerably from last check. Victoria Soto has an appt Wednesday to see Dr. Emelda Fear. I advised to keep that appt and he may want to recheck numbers then. Victoria Soto voiced understanding. JSY

## 2017-04-02 ENCOUNTER — Telehealth: Payer: Self-pay | Admitting: *Deleted

## 2017-04-02 ENCOUNTER — Other Ambulatory Visit: Payer: Self-pay | Admitting: Women's Health

## 2017-04-02 ENCOUNTER — Ambulatory Visit: Payer: Medicaid Other | Admitting: Obstetrics and Gynecology

## 2017-04-02 DIAGNOSIS — O039 Complete or unspecified spontaneous abortion without complication: Secondary | ICD-10-CM

## 2017-04-02 NOTE — Telephone Encounter (Signed)
Informed patient she needs to repeat bhcg weekly until <5. Verbalized understanding. States she will come tomorrow.

## 2017-04-03 ENCOUNTER — Other Ambulatory Visit: Payer: Medicaid Other

## 2017-04-07 ENCOUNTER — Encounter: Payer: Medicaid Other | Admitting: Women's Health

## 2017-04-07 ENCOUNTER — Ambulatory Visit: Payer: Medicaid Other | Admitting: *Deleted

## 2017-04-07 ENCOUNTER — Other Ambulatory Visit: Payer: Self-pay | Admitting: Women's Health

## 2017-04-09 ENCOUNTER — Ambulatory Visit (INDEPENDENT_AMBULATORY_CARE_PROVIDER_SITE_OTHER): Payer: Medicaid Other | Admitting: Obstetrics and Gynecology

## 2017-04-09 ENCOUNTER — Encounter: Payer: Self-pay | Admitting: Obstetrics and Gynecology

## 2017-04-09 VITALS — BP 100/60 | HR 72 | Ht 64.0 in | Wt 135.0 lb

## 2017-04-09 DIAGNOSIS — Z3201 Encounter for pregnancy test, result positive: Secondary | ICD-10-CM | POA: Diagnosis not present

## 2017-04-09 DIAGNOSIS — O021 Missed abortion: Secondary | ICD-10-CM

## 2017-04-09 LAB — POCT URINE PREGNANCY: PREG TEST UR: POSITIVE — AB

## 2017-04-09 NOTE — Progress Notes (Addendum)
Patient ID: Marcellina Millin, female   DOB: 1986-09-14, 31 y.o.   MRN: [id number]    [facility] Clinic Visit  @DATE @            Patient name: ANASTYN AYARS MRN [id number]  Date of birth: 1986/04/01  CC & HPI:   Chief Complaint  Patient presents with   Follow-up    missed ab 2 wks ago, still bleeding     Sury KIMIKA STREATER is a 31 y.o. female presenting today for f/u of miscarriage 3w ago. Pt states she passed large clots during the miscarriage and her HCG dropped to 5000. She reports since that time, she has been having light bleeding and small clots, similar to the end of a period, in addition to mild abdominal cramping. She reports she uses about 1 pad per day. She does not plan on using any birth control as she plans to conceive soon.   ROS:  ROS +vaginal bleeding, mild abdominal cramping   Pertinent History Reviewed:   Reviewed: Significant for miscarriage  Medical         Past Medical History:  Diagnosis Date   Depression    Headache(784.0)    No pertinent past medical history                               Surgical Hx:    Past Surgical History:  Procedure Laterality Date   NO PAST SURGERIES     Medications: Reviewed & Updated - see associated section                       Current Outpatient Prescriptions:    ketorolac (TORADOL) 10 MG tablet, Take 1 tablet (10 mg total) by mouth every 6 (six) hours as needed. (Patient not taking: Reported on 04/09/2017), Disp: 20 tablet, Rfl: 0   oxyCODONE-acetaminophen (PERCOCET/ROXICET) 5-325 MG tablet, Take 1-2 tablets by mouth every 4 (four) hours as needed. (Patient not taking: Reported on 03/27/2017), Disp: 20 tablet, Rfl: 0   Social History: Reviewed -  reports that she has been smoking Cigarettes.  She has a 5.00 pack-year smoking history. She has never used smokeless tobacco.  Objective Findings:  Vitals: Blood pressure 100/60, pulse 72, height 5\' 4"  (1.626 m), weight 135 lb (61.2 kg), last menstrual period 12/28/2016,  unknown if currently breastfeeding.  Physical Examination: General appearance - alert, well appearing, and in no distress Mental status - alert, oriented to person, place, and time Abdomen - soft, nontender, nondistended, no masses or organomegaly Pelvic -  VULVA: normal appearing vulva with no masses, tenderness or lesions,  VAGINA: normal appearing vagina with normal color and discharge, no lesions,  CERVIX: normal appearing cervix without discharge or lesions   Assessment & Plan:   A:  1. 3w s/p miscarriage  2. Planning to conceive again   P:  1. Urine preg today,  positive  Will obtain serum HCG and consider u/s , cytotec. 2. 1 normal period prior to next conception attempt (~ July/August 2018)  3. F/u PRN.     By signing my name below, I, Doreatha Martin, attest that this documentation has been prepared under the direction and in the presence of Tilda Burrow, MD. Electronically Signed: Doreatha Martin, ED Scribe. 04/09/17. 2:06 PM.  I personally performed the services described in this documentation, which was SCRIBED in my presence. The recorded information has been reviewed and considered  accurate. It has been edited as necessary during review. Tilda Burrow, MD

## 2017-04-09 NOTE — Progress Notes (Signed)
CC & HPI:   Chief Complaint  Patient presents with  . Follow-up    missed ab 2 wks ago, still bleeding     Victoria Soto is a 31 y.o. female presenting today for f/u of miscarriage 3w ago. Pt states she passed large clots during the miscarriage and her HCG dropped to 5000. She reports since that time, she has been having light bleeding and small clots, similar to the end of a period, in addition to mild abdominal cramping. She reports she uses about 1 pad per day. She does not plan on using any birth control as she plans to conceive soon.   ROS:  ROS +vaginal bleeding, mild abdominal cramping   Pertinent History Reviewed:   Reviewed: Significant for miscarriage  Medical         Past Medical History:  Diagnosis Date  . Depression   . Headache(784.0)   . No pertinent past medical history                               Surgical Hx:    Past Surgical History:  Procedure Laterality Date  . NO PAST SURGERIES     Medications: Reviewed & Updated - see associated section                       Current Outpatient Prescriptions:  .  ketorolac (TORADOL) 10 MG tablet, Take 1 tablet (10 mg total) by mouth every 6 (six) hours as needed. (Patient not taking: Reported on 04/09/2017), Disp: 20 tablet, Rfl: 0 .  oxyCODONE-acetaminophen (PERCOCET/ROXICET) 5-325 MG tablet, Take 1-2 tablets by mouth every 4 (four) hours as needed. (Patient not taking: Reported on 03/27/2017), Disp: 20 tablet, Rfl: 0   Social History: Reviewed -  reports that she has been smoking Cigarettes.  She has a 5.00 pack-year smoking history. She has never used smokeless tobacco.  Objective Findings:  Vitals: Blood pressure 100/60, pulse 72, height 5\' 4"  (1.626 m), weight 135 lb (61.2 kg), last menstrual period 12/28/2016, unknown if currently breastfeeding.  Physical Examination: General appearance - alert, well appearing, and in no distress Mental status - alert, oriented to person, place, and time Abdomen - soft, nontender,  nondistended, no masses or organomegaly Pelvic -  VULVA: normal appearing vulva with no masses, tenderness or lesions,  VAGINA: normal appearing vagina with normal color and discharge, no lesions,  CERVIX: normal appearing cervix without discharge or lesions   Assessment & Plan:   A:  1. 3w s/p miscarriage  2. Planning to conceive again   P:  1. Urine preg today,  positive  Will obtain serum HCG and consider u/s , cytotec. 2. 1 normal period prior to next conception attempt (~ July/August 2018)  3. F/u PRN.     By signing my name below, I, Doreatha Martin, attest that this documentation has been prepared under the direction and in the presence of Tilda Burrow, MD. Electronically Signed: Doreatha Martin, ED Scribe. 04/09/17. 2:06 PM.  I personally performed the services described in this documentation, which was SCRIBED in my presence. The recorded information has been reviewed and considered accurate. It has been edited as necessary during review. Tilda Burrow, MD

## 2017-04-10 LAB — BETA HCG QUANT (REF LAB): hCG Quant: 177 m[IU]/mL

## 2017-06-10 ENCOUNTER — Telehealth: Payer: Self-pay | Admitting: Adult Health

## 2017-06-10 ENCOUNTER — Inpatient Hospital Stay (HOSPITAL_COMMUNITY): Payer: Medicaid Other

## 2017-06-10 ENCOUNTER — Inpatient Hospital Stay (HOSPITAL_COMMUNITY)
Admission: AD | Admit: 2017-06-10 | Discharge: 2017-06-10 | Disposition: A | Discharge status: Home / Self Care | Payer: Medicaid Other | Source: Ambulatory Visit | Attending: Family Medicine | Admitting: Family Medicine

## 2017-06-10 ENCOUNTER — Encounter (HOSPITAL_COMMUNITY): Payer: Self-pay | Admitting: *Deleted

## 2017-06-10 DIAGNOSIS — R109 Unspecified abdominal pain: Secondary | ICD-10-CM | POA: Insufficient documentation

## 2017-06-10 DIAGNOSIS — Z3A01 Less than 8 weeks gestation of pregnancy: Secondary | ICD-10-CM | POA: Diagnosis not present

## 2017-06-10 DIAGNOSIS — O99331 Smoking (tobacco) complicating pregnancy, first trimester: Secondary | ICD-10-CM | POA: Diagnosis not present

## 2017-06-10 DIAGNOSIS — R102 Pelvic and perineal pain: Secondary | ICD-10-CM | POA: Diagnosis not present

## 2017-06-10 DIAGNOSIS — F17210 Nicotine dependence, cigarettes, uncomplicated: Secondary | ICD-10-CM | POA: Insufficient documentation

## 2017-06-10 DIAGNOSIS — O26891 Other specified pregnancy related conditions, first trimester: Secondary | ICD-10-CM | POA: Insufficient documentation

## 2017-06-10 LAB — URINALYSIS, ROUTINE W REFLEX MICROSCOPIC
Bilirubin Urine: NEGATIVE
GLUCOSE, UA: NEGATIVE mg/dL
Hgb urine dipstick: NEGATIVE
Ketones, ur: NEGATIVE mg/dL
Nitrite: NEGATIVE
PH: 6 (ref 5.0–8.0)
Protein, ur: NEGATIVE mg/dL
Specific Gravity, Urine: 1.021 (ref 1.005–1.030)

## 2017-06-10 LAB — CBC
HCT: 37.9 % (ref 36.0–46.0)
HEMOGLOBIN: 12.7 g/dL (ref 12.0–15.0)
MCH: 29.5 pg (ref 26.0–34.0)
MCHC: 33.5 g/dL (ref 30.0–36.0)
MCV: 88.1 fL (ref 78.0–100.0)
PLATELETS: 224 10*3/uL (ref 150–400)
RBC: 4.3 MIL/uL (ref 3.87–5.11)
RDW: 12.9 % (ref 11.5–15.5)
WBC: 9.9 10*3/uL (ref 4.0–10.5)

## 2017-06-10 LAB — POCT PREGNANCY, URINE: Preg Test, Ur: POSITIVE — AB

## 2017-06-10 LAB — WET PREP, GENITAL
Clue Cells Wet Prep HPF POC: NONE SEEN
SPERM: NONE SEEN
TRICH WET PREP: NONE SEEN
YEAST WET PREP: NONE SEEN

## 2017-06-10 LAB — HCG, QUANTITATIVE, PREGNANCY: hCG, Beta Chain, Quant, S: 34259 m[IU]/mL — ABNORMAL HIGH (ref <5)

## 2017-06-10 NOTE — MAU Note (Signed)
Pt reports she had a miscarriage at the end of May. States she had a +upt today. States she has some lower abdominal cramping. Pt denies vaginal bleeding. Reports white discharge. LMP: unsure? June?

## 2017-06-10 NOTE — MAU Provider Note (Signed)
PAtient Victoria Soto is a 31 y.o. [phone] at Unknown here with complaints of abdominal pain today. The pain started today after she found out she was pregnant. She has not had a period since February, and she had a miscarriage in May.  Patient denies bleeding or other gyn-ob complaints. She is here to get checked out because FT couldn't see her for 3 weeks and she is worried that she could be having a miscarriage.  History     CSN: [id number]  Arrival date and time: 06/10/17 1909   None     Chief Complaint  Patient presents with  . Possible Pregnancy  . Abdominal Pain   Abdominal Pain  This is a new problem. The current episode started today. The onset quality is sudden. The problem occurs intermittently. The problem has been resolved. The pain is located in the suprapubic region. The pain is at a severity of 3/10. The quality of the pain is cramping. The abdominal pain does not radiate. Nothing aggravates the pain. The pain is relieved by nothing. She has tried nothing for the symptoms.   Patient says that the pain was a 5/10 before, but now it is a 3/10.    Past Medical History:  Diagnosis Date  . Depression   . Headache(784.0)   . No pertinent past medical history     Past Surgical History:  Procedure Laterality Date  . NO PAST SURGERIES      Family History  Problem Relation Age of Onset  . Muscular dystrophy Other   . Hypertension Mother   . Diabetes Father   . Anesthesia problems Neg Hx     Social History  Substance Use Topics  . Smoking status: Current Every Day Smoker    Packs/day: 0.50    Years: 10.00    Types: Cigarettes    Last attempt to quit: 02/18/2017  . Smokeless tobacco: Never Used  . Alcohol use No    Allergies: No Known Allergies  Prescriptions Prior to Admission  Medication Sig Dispense Refill Last Dose  . ketorolac (TORADOL) 10 MG tablet Take 1 tablet (10 mg total) by mouth every 6 (six) hours as needed. (Patient not taking: Reported on  04/09/2017) 20 tablet 0 Not Taking  . oxyCODONE-acetaminophen (PERCOCET/ROXICET) 5-325 MG tablet Take 1-2 tablets by mouth every 4 (four) hours as needed. (Patient not taking: Reported on 03/27/2017) 20 tablet 0 Not Taking    Review of Systems  Respiratory: Negative.   Cardiovascular: Negative.   Gastrointestinal: Positive for abdominal pain.  Genitourinary: Negative.   Musculoskeletal: Negative.   Neurological: Negative.    Physical Exam   Blood pressure 118/67, pulse 99, temperature 98.8 F (37.1 C), temperature source Oral, resp. rate 16, height 5\' 4"  (1.626 m), weight 130 lb (59 kg), last menstrual period 12/28/2016, SpO2 97 %, unknown if currently breastfeeding.  Physical Exam  Constitutional: She is oriented to person, place, and time. She appears well-developed and well-nourished.  HENT:  Head: Normocephalic.  Neck: Normal range of motion.  Respiratory: Effort normal.  GI: Soft.  Genitourinary:  Genitourinary Comments: NEFG; no blood in the vagina. No discharge, no CMT, adnexal or suprapubic tenderness.   Musculoskeletal: Normal range of motion.  Neurological: She is alert and oriented to person, place, and time.  Skin: Skin is warm and dry.  Psychiatric: She has a normal mood and affect.    MAU Course  Procedures  MDM -Ectopic work-up US Ob Comp Less 14 Wks  Result Date: 06/10/2017  CLINICAL DATA:  Acute onset of pelvic cramping.  Initial encounter. EXAM: OBSTETRIC <14 WK [place] AND TRANSVAGINAL OB US TECHNIQUE: Both transabdominal and transvaginal ultrasound examinations were performed for complete evaluation of the gestation as well as the maternal uterus, adnexal regions, and pelvic cul-de-sac. Transvaginal technique was performed to assess early pregnancy. COMPARISON:  Pelvic ultrasound performed 03/24/2017 FINDINGS: Intrauterine gestational sac: Single; visualized and normal in shape. Yolk sac:  Yes Embryo:  Yes Cardiac Activity: Yes Heart Rate: 112  bpm CRL:  0.4 cm   6 w    1 d                  [place] EDC: 02/02/2018 Subchorionic hemorrhage:  None visualized. Maternal uterus/adnexae: The uterus is otherwise unremarkable in appearance. The ovaries are within normal limits. The right ovary measures 2.6 x 2.1 x 2.6 cm, while the left ovary measures 2.8 x 2.6 x 2.8 cm. No suspicious adnexal masses are seen; there is no evidence for ovarian torsion. No free fluid is seen within the pelvic cul-de-sac. IMPRESSION: Single live intrauterine pregnancy noted, with a crown-rump length of 4 mm, corresponding to a gestational age of [redacted] weeks 1 day. No LMP is available for comparison. This reflects an estimated date of delivery of February 02, 2018. Electronically Signed   By: Roanna Raider M.D.   On: 06/10/2017 22:51   US Ob Transvaginal  Result Date: 06/10/2017 CLINICAL DATA:  Acute onset of pelvic cramping.  Initial encounter. EXAM: OBSTETRIC <14 WK [place] AND TRANSVAGINAL OB US TECHNIQUE: Both transabdominal and transvaginal ultrasound examinations were performed for complete evaluation of the gestation as well as the maternal uterus, adnexal regions, and pelvic cul-de-sac. Transvaginal technique was performed to assess early pregnancy. COMPARISON:  Pelvic ultrasound performed 03/24/2017 FINDINGS: Intrauterine gestational sac: Single; visualized and normal in shape. Yolk sac:  Yes Embryo:  Yes Cardiac Activity: Yes Heart Rate: 112  bpm CRL:  0.4 cm   6 w   1 d                  [place] EDC: 02/02/2018 Subchorionic hemorrhage:  None visualized. Maternal uterus/adnexae: The uterus is otherwise unremarkable in appearance. The ovaries are within normal limits. The right ovary measures 2.6 x 2.1 x 2.6 cm, while the left ovary measures 2.8 x 2.6 x 2.8 cm. No suspicious adnexal masses are seen; there is no evidence for ovarian torsion. No free fluid is seen within the pelvic cul-de-sac. IMPRESSION: Single live intrauterine pregnancy noted, with a crown-rump length of 4 mm, corresponding to a gestational age of [redacted]  weeks 1 day. No LMP is available for comparison. This reflects an estimated date of delivery of February 02, 2018. Electronically Signed   By: Roanna Raider M.D.   On: 06/10/2017 22:51     Assessment and Plan   1. Abdominal pain during pregnancy in first trimester   2. Pelvic pain affecting pregnancy in first trimester, antepartum    2. Patient stable for discharge with recommendations to begin prenatal care.  3. All questions answered.   Charlesetta Garibaldi Kooistra CNM 06/10/2017, 9:39 PM

## 2017-06-10 NOTE — Discharge Instructions (Signed)

## 2017-06-11 LAB — HIV ANTIBODY (ROUTINE TESTING W REFLEX): HIV SCREEN 4TH GENERATION: NONREACTIVE

## 2017-06-11 LAB — GC/CHLAMYDIA PROBE AMP (~~LOC~~) NOT AT ARMC
Chlamydia: NEGATIVE
Neisseria Gonorrhea: NEGATIVE

## 2017-06-11 NOTE — Telephone Encounter (Signed)
Left message for pt to get worked in today

## 2017-06-12 ENCOUNTER — Telehealth: Payer: Self-pay | Admitting: *Deleted

## 2017-06-12 MED ORDER — DOXYLAMINE-PYRIDOXINE ER 20-20 MG PO TBCR: 1.0000 | EXTENDED_RELEASE_TABLET | Freq: Every day | ORAL | 8 refills | Status: DC

## 2017-06-17 ENCOUNTER — Telehealth: Payer: Self-pay | Admitting: *Deleted

## 2017-06-17 MED ORDER — DOXYLAMINE-PYRIDOXINE 10-10 MG PO TBEC: DELAYED_RELEASE_TABLET | ORAL | 3 refills | Status: DC

## 2017-06-17 NOTE — Telephone Encounter (Signed)
done

## 2017-06-17 NOTE — Telephone Encounter (Signed)
Received fax. Pt's insurance has denied [place]. Can you prescribe Diclegis? Thanks!! JSY

## 2017-06-17 NOTE — Telephone Encounter (Signed)
Left message letting pt know her insurance has denied covering [place] but Drenda Freeze, [place] ordered Diclegis. JSY

## 2017-06-19 ENCOUNTER — Telehealth: Payer: Self-pay | Admitting: Adult Health

## 2017-06-19 NOTE — Telephone Encounter (Signed)
Spoke with pt letting her know Diclegis has been approved. Faxed approval to [employer] in [place]. JSY

## 2017-06-23 ENCOUNTER — Encounter: Payer: Self-pay | Admitting: Adult Health

## 2017-06-23 ENCOUNTER — Ambulatory Visit (INDEPENDENT_AMBULATORY_CARE_PROVIDER_SITE_OTHER): Payer: Medicaid Other | Admitting: Adult Health

## 2017-06-23 ENCOUNTER — Ambulatory Visit: Payer: Medicaid Other | Admitting: Adult Health

## 2017-06-23 VITALS — BP 112/68 | HR 80 | Ht 64.0 in | Wt 129.5 lb

## 2017-06-23 DIAGNOSIS — O219 Vomiting of pregnancy, unspecified: Secondary | ICD-10-CM

## 2017-06-23 DIAGNOSIS — Z3A08 8 weeks gestation of pregnancy: Secondary | ICD-10-CM

## 2017-06-23 MED ORDER — DOXYLAMINE-PYRIDOXINE ER 20-20 MG PO TBCR: 1.0000 | EXTENDED_RELEASE_TABLET | Freq: Two times a day (BID) | ORAL | 0 refills | Status: DC

## 2017-06-23 NOTE — Patient Instructions (Addendum)
Eat often  Follow up in 1 week for new oB and intake

## 2017-06-23 NOTE — Progress Notes (Signed)
Subjective:     Patient ID: Victoria Soto, female   DOB: 1986-01-01, 31 y.o.   MRN: [id number]  HPI Victoria Soto is a 31 year old white female in complaining of nausea esp, some vomiting, is about [redacted] weeks pregnant now.   Review of Systems Nausea, some vomiting Reviewed past medical,surgical, social and family history. Reviewed medications and allergies.     Objective:   Physical Exam BP 112/68 (BP Location: Left Arm, Patient Position: Sitting, Cuff Size: Normal)   Pulse 80   Ht 5\' 4"  (1.626 m)   Wt 129 lb 8 oz (58.7 kg)   LMP 12/28/2016 (LMP Unknown)   Breastfeeding? No   BMI 22.23 kg/m Has lost about 1/2 lbs, had [place] at [facility] with +[facility] 8/7. Need to eat often, check on medicaid, will give samples of bonjesta.     Assessment:     1. Nausea and vomiting during pregnancy prior to [redacted] weeks gestation       Plan:     Meds ordered this encounter  Medications  . Prenatal Vit-Fe Fumarate-FA (PRENATAL VITAMIN PO)    Sig: Take by mouth daily.  . Doxylamine-Pyridoxine ER (BONJESTA) 20-20 MG TBCR    Sig: Take 1 tablet by mouth 2 (two) times daily at 8 am and 10 pm.    Dispense:  30 tablet    Refill:  0    Order Specific Question:   Supervising Provider    Answer:   Lazaro Arms [[id number]]  lot # [id number] exp 05/03/18 Eat often  Return in about 1 week for new OB visit

## 2017-06-30 ENCOUNTER — Ambulatory Visit: Payer: Medicaid Other | Admitting: Adult Health

## 2017-07-02 ENCOUNTER — Telehealth: Payer: Self-pay | Admitting: *Deleted

## 2017-07-02 NOTE — Telephone Encounter (Signed)
Pt called stating that she has been having some pain on her left side near her lower abdomen since 8pm last night. She states that she tried taking a warm bath and that did not help. She states that she is not having vaginal bleeding. I advised pt that since it is only on one side that it could be round ligament pain. She states no heavy lifting. I advised pt to push fluids, drinking so much that her urine is clear and to call us back if she experiences any bleeding or the pain continues to get worse. Pt verbalized understanding.

## 2017-07-04 ENCOUNTER — Ambulatory Visit (INDEPENDENT_AMBULATORY_CARE_PROVIDER_SITE_OTHER): Payer: Medicaid Other | Admitting: Women's Health

## 2017-07-04 ENCOUNTER — Ambulatory Visit: Payer: Medicaid Other | Admitting: *Deleted

## 2017-07-04 ENCOUNTER — Encounter: Payer: Self-pay | Admitting: Women's Health

## 2017-07-04 VITALS — BP 110/76 | HR 80 | Wt 129.0 lb

## 2017-07-04 DIAGNOSIS — Z720 Tobacco use: Secondary | ICD-10-CM | POA: Diagnosis not present

## 2017-07-04 DIAGNOSIS — F17200 Nicotine dependence, unspecified, uncomplicated: Secondary | ICD-10-CM | POA: Diagnosis not present

## 2017-07-04 DIAGNOSIS — Z8759 Personal history of other complications of pregnancy, childbirth and the puerperium: Secondary | ICD-10-CM

## 2017-07-04 DIAGNOSIS — Z3A09 9 weeks gestation of pregnancy: Secondary | ICD-10-CM | POA: Diagnosis not present

## 2017-07-04 DIAGNOSIS — Z3682 Encounter for antenatal screening for nuchal translucency: Secondary | ICD-10-CM

## 2017-07-04 DIAGNOSIS — F418 Other specified anxiety disorders: Secondary | ICD-10-CM | POA: Insufficient documentation

## 2017-07-04 DIAGNOSIS — F329 Major depressive disorder, single episode, unspecified: Secondary | ICD-10-CM

## 2017-07-04 DIAGNOSIS — O99331 Smoking (tobacco) complicating pregnancy, first trimester: Secondary | ICD-10-CM

## 2017-07-04 DIAGNOSIS — O99341 Other mental disorders complicating pregnancy, first trimester: Secondary | ICD-10-CM | POA: Diagnosis not present

## 2017-07-04 DIAGNOSIS — Z3490 Encounter for supervision of normal pregnancy, unspecified, unspecified trimester: Secondary | ICD-10-CM | POA: Insufficient documentation

## 2017-07-04 DIAGNOSIS — Z3481 Encounter for supervision of other normal pregnancy, first trimester: Secondary | ICD-10-CM

## 2017-07-04 DIAGNOSIS — Z1389 Encounter for screening for other disorder: Secondary | ICD-10-CM

## 2017-07-04 DIAGNOSIS — O26891 Other specified pregnancy related conditions, first trimester: Secondary | ICD-10-CM | POA: Diagnosis not present

## 2017-07-04 DIAGNOSIS — N898 Other specified noninflammatory disorders of vagina: Secondary | ICD-10-CM | POA: Diagnosis not present

## 2017-07-04 DIAGNOSIS — Z331 Pregnant state, incidental: Secondary | ICD-10-CM

## 2017-07-04 LAB — POCT URINALYSIS DIPSTICK
Glucose, UA: NEGATIVE
KETONES UA: NEGATIVE
Leukocytes, UA: NEGATIVE
Nitrite, UA: NEGATIVE
PROTEIN UA: NEGATIVE
RBC UA: NEGATIVE

## 2017-07-04 LAB — POCT WET PREP (WET MOUNT)
Clue Cells Wet Prep Whiff POC: NEGATIVE
TRICHOMONAS WET PREP HPF POC: ABSENT

## 2017-07-04 NOTE — Patient Instructions (Signed)

## 2017-07-04 NOTE — Progress Notes (Signed)
Subjective:  Victoria Soto is a 31 y.o. [phone] Caucasian female at [redacted]w[redacted]d by 6wk u/s, being seen today for her first obstetrical visit.  Her obstetrical history is significant for sab x 2, term uncomplicated svb x 2, smoker: 1ppd prior to pregnancy, now 5 cigaretes/day and working on quitting completely. Dep/anx, never been on meds, used to do counseling w/ [facility], but they never let her see a MD, so she would rather go somewhere else. Does want to resume counseling. Denies SI.  Pregnancy history fully reviewed.  Patient reports cramping, increased d/c, no odor/itching, some irritation. Denies vb, uti s/s.  BP 110/76   Pulse 80   Wt 129 lb (58.5 kg)   LMP 12/28/2016 (LMP Unknown)   BMI 22.14 kg/m   HISTORY: OB History  Gravida Para Term Preterm AB Living  5 2 2  0 2 2  SAB TAB Ectopic Multiple Live Births  2 0 0 0 2    # Outcome Date GA Lbr Len/2nd Weight Sex Delivery Anes PTL Lv  5 Current           4 SAB 03/2017          3 Term 05/19/12 [redacted]w[redacted]d 11:48 / 00:17 7 lb 12.2 oz (3.52 kg) M Vag-Spont EPI N LIV     Birth Comments: Normal newborn  2 Term 11/28/07 [redacted]w[redacted]d  7 lb 14 oz (3.572 kg) F Vag-Spont EPI N LIV     Birth Comments: late prenatal care  1 SAB 2007             Past Medical History:  Diagnosis Date  . Depression   . Headache(784.0)   . No pertinent past medical history    Past Surgical History:  Procedure Laterality Date  . NO PAST SURGERIES     Family History  Problem Relation Age of Onset  . Muscular dystrophy Other   . Hypertension Mother   . Diabetes Father   . Cirrhosis Paternal Grandfather   . Anesthesia problems Neg Hx     Exam   System:     General: Well developed & nourished, no acute distress   Skin: Warm & dry, normal coloration and turgor, no rashes   Neurologic: Alert & oriented, normal mood   Cardiovascular: Regular rate & rhythm   Respiratory: Effort & rate normal, LCTAB, acyanotic   Abdomen: Soft, non tender   Extremities: normal  strength, tone   Pelvic Exam:    Perineum: Normal perineum   Vulva: Normal, no lesions   Vagina:  Normal mucosa, normal discharge, nonodorous   Cervix: Normal, bulbous, appears closed   Uterus: Normal size/shape/contour for GA   Thin prep pap smear 04/25/15, neg  FHR: + via informal transabdominal u/s, +active fetus   Assessment:   Pregnancy: [id number] Patient Active Problem List   Diagnosis Date Noted  . Supervision of normal pregnancy 07/04/2017  . Smoker 04/25/2015  . NSVD (normal spontaneous vaginal delivery) 05/19/2012    [redacted]w[redacted]d [id number] New OB visit Depression/anxiety Smoker  Plan:  Initial labs obtained Continue prenatal vitamins Problem list reviewed and updated Reviewed n/v relief measures and warning s/s to report Reviewed recommended weight gain based on pre-gravid BMI Encouraged well-balanced diet Genetic Screening discussed Integrated Screen: requested Cystic fibrosis screening discussed requested Ultrasound discussed; fetal survey: requested Follow up in 4 weeks for 1st IT/NT and visit [facility] completed Referral to [facility] sent today Smokes 5 cigarettes/day, advised cessation, discussed risks to fetus while pregnant, to infant pp,  and to herself. Offered QuitlineNC, declined, quitting on her own.     Marge Duncans CNM, [facility] 07/04/2017 10:10 AM

## 2017-07-05 LAB — MED LIST OPTION NOT SELECTED

## 2017-07-06 LAB — URINE CULTURE

## 2017-07-07 LAB — GC/CHLAMYDIA PROBE AMP
CHLAMYDIA, DNA PROBE: NEGATIVE
NEISSERIA GONORRHOEAE BY PCR: NEGATIVE

## 2017-07-08 LAB — PMP SCREEN PROFILE (10S), URINE
Amphetamine Scrn, Ur: NEGATIVE ng/mL
BARBITURATE SCREEN URINE: NEGATIVE ng/mL
BENZODIAZEPINE SCREEN, URINE: NEGATIVE ng/mL
CANNABINOIDS UR QL SCN: NEGATIVE ng/mL
Cocaine (Metab) Scrn, Ur: NEGATIVE ng/mL
Creatinine(Crt), U: 134.9 mg/dL (ref 20.0–300.0)
METHADONE SCREEN, URINE: NEGATIVE ng/mL
OXYCODONE+OXYMORPHONE UR QL SCN: NEGATIVE ng/mL
Opiate Scrn, Ur: NEGATIVE ng/mL
PROPOXYPHENE SCREEN URINE: NEGATIVE ng/mL
Ph of Urine: 6.8 (ref 4.5–8.9)
Phencyclidine Qn, Ur: NEGATIVE ng/mL

## 2017-07-11 LAB — URINALYSIS, ROUTINE W REFLEX MICROSCOPIC
Bilirubin, UA: NEGATIVE
GLUCOSE, UA: NEGATIVE
KETONES UA: NEGATIVE
LEUKOCYTES UA: NEGATIVE
NITRITE UA: NEGATIVE
PROTEIN UA: NEGATIVE
RBC UA: NEGATIVE
SPEC GRAV UA: 1.023 (ref 1.005–1.030)
Urobilinogen, Ur: 0.2 mg/dL (ref 0.2–1.0)
pH, UA: 7 (ref 5.0–7.5)

## 2017-07-11 LAB — CBC
Hematocrit: 41.8 % (ref 34.0–46.6)
Hemoglobin: 14.2 g/dL (ref 11.1–15.9)
MCH: 29.4 pg (ref 26.6–33.0)
MCHC: 34 g/dL (ref 31.5–35.7)
MCV: 87 fL (ref 79–97)
PLATELETS: 247 10*3/uL (ref 150–379)
RBC: 4.83 x10E6/uL (ref 3.77–5.28)
RDW: 13.7 % (ref 12.3–15.4)
WBC: 8.7 10*3/uL (ref 3.4–10.8)

## 2017-07-11 LAB — HIV ANTIBODY (ROUTINE TESTING W REFLEX): HIV SCREEN 4TH GENERATION: NONREACTIVE

## 2017-07-11 LAB — ABO/RH: RH TYPE: POSITIVE

## 2017-07-11 LAB — HEPATITIS B SURFACE ANTIGEN: Hepatitis B Surface Ag: NEGATIVE

## 2017-07-11 LAB — CYSTIC FIBROSIS MUTATION 97: GENE DIS ANAL CARRIER INTERP BLD/T-IMP: NOT DETECTED

## 2017-07-11 LAB — VARICELLA ZOSTER ANTIBODY, IGG: Varicella zoster IgG: 851 index (ref >165)

## 2017-07-11 LAB — ANTIBODY SCREEN: ANTIBODY SCREEN: NEGATIVE

## 2017-07-11 LAB — RUBELLA SCREEN: Rubella Antibodies, IGG: 2.95 index (ref >0.99)

## 2017-07-11 LAB — RPR: RPR Ser Ql: NONREACTIVE

## 2017-07-30 ENCOUNTER — Ambulatory Visit (INDEPENDENT_AMBULATORY_CARE_PROVIDER_SITE_OTHER): Payer: Medicaid Other

## 2017-07-30 ENCOUNTER — Ambulatory Visit (INDEPENDENT_AMBULATORY_CARE_PROVIDER_SITE_OTHER): Payer: Medicaid Other | Admitting: Women's Health

## 2017-07-30 ENCOUNTER — Encounter: Payer: Self-pay | Admitting: Women's Health

## 2017-07-30 VITALS — BP 104/70 | HR 86 | Wt 130.0 lb

## 2017-07-30 DIAGNOSIS — Z3682 Encounter for antenatal screening for nuchal translucency: Secondary | ICD-10-CM

## 2017-07-30 DIAGNOSIS — Z331 Pregnant state, incidental: Secondary | ICD-10-CM

## 2017-07-30 DIAGNOSIS — Z3A13 13 weeks gestation of pregnancy: Secondary | ICD-10-CM

## 2017-07-30 DIAGNOSIS — Z1389 Encounter for screening for other disorder: Secondary | ICD-10-CM

## 2017-07-30 DIAGNOSIS — Z3402 Encounter for supervision of normal first pregnancy, second trimester: Secondary | ICD-10-CM

## 2017-07-30 DIAGNOSIS — Z3481 Encounter for supervision of other normal pregnancy, first trimester: Secondary | ICD-10-CM

## 2017-07-30 LAB — POCT URINALYSIS DIPSTICK
GLUCOSE UA: NEGATIVE
Ketones, UA: NEGATIVE
Nitrite, UA: NEGATIVE
PROTEIN UA: NEGATIVE

## 2017-07-30 MED ORDER — PROMETHAZINE HCL 25 MG PO TABS: 12.5000 mg | ORAL_TABLET | Freq: Four times a day (QID) | ORAL | 0 refills | Status: DC | PRN

## 2017-07-30 NOTE — Progress Notes (Signed)
[place] 13+2 wks,measurements c/w dates,normal ovaries bilat,fhr 150 bpm,NB present, NT 1.4 mm,crl 74.29 mm,post pl gr 0

## 2017-07-30 NOTE — Progress Notes (Signed)
   [facility] Low-Risk Pregnancy Visit  Patient name: Victoria Soto MRN [id number]  Date of birth: Dec 01, 1985 CC & HPI:  Victoria Soto is a 31 y.o. [id number] female at [redacted]w[redacted]d with an Estimated Date of Delivery: 02/02/18 being seen today for ongoing management of a low-risk pregnancy.   Today she reports continued nausea, diclegis not really helping, does want to try something else, phenergan has worked well in the past  Review of Systems:   Reports no fm.   Denies regular uc's, lof, vb, or uti s/s. No complaints.  Pertinent History Reviewed:  Reviewed past medical,surgical and family history.  Reviewed problem list, medications and allergies.  Objective Findings:   Vitals:   07/30/17 1014  BP: 104/70  Pulse: 86  Weight: 130 lb (59 kg)    Body mass index is 22.31 kg/m.  Fundal height: 13wks Fetal heart rate: 150 u/s Today's NT u/s: [place] 13+2wks, measurements c/w dates, normal ovaries bilat, fhr 150bpm, NB present, NT 1.22mm, crl 74.28mm, post pl gr 0  Results for orders placed or performed in visit on 07/30/17 (from the past 24 hour(s))  POCT Urinalysis Dipstick   Collection Time: 07/30/17 10:15 AM  Result Value Ref Range   Color, UA     Clarity, UA     Glucose, UA neg    Bilirubin, UA     Ketones, UA neg    Spec Grav, UA  1.010 - 1.025   Blood, UA trace    pH, UA  5.0 - 8.0   Protein, UA neg    Urobilinogen, UA  0.2 or 1.0 E.U./dL   Nitrite, UA neg    Leukocytes, UA Trace (A) Negative     Assessment & Plan:   1) Low-risk pregnancy [id number] at [redacted]w[redacted]d with an Estimated Date of Delivery: 02/02/18  2) Nausea of pregnancy> rx phenergan, gave printed prevention/relief measures  Reviewed: today's normal nt [place], warning s/s to report. All questions were answered  1st IT today  Plan:  Continue routine obstetrical care   Return in about 3 weeks (around 08/20/2017) for LROB, 2nd IT.  Orders Placed This Encounter  Procedures  . Integrated 1  . POCT Urinalysis Dipstick     Marge Duncans CNM, [facility] 07/30/2017 10:52 AM

## 2017-07-30 NOTE — Patient Instructions (Signed)

## 2017-08-01 ENCOUNTER — Encounter: Payer: Self-pay | Admitting: Women's Health

## 2017-08-01 ENCOUNTER — Encounter: Payer: Self-pay | Admitting: Obstetrics and Gynecology

## 2017-08-01 ENCOUNTER — Other Ambulatory Visit: Payer: Self-pay

## 2017-08-04 LAB — INTEGRATED 1
Crown Rump Length: 74.3 mm
Gest. Age on Collection Date: 13.1 weeks
Maternal Age at EDD: 30.5 yr
Nuchal Translucency (NT): 1.4 mm
Number of Fetuses: 1
PAPP-A Value: 2494.7 ng/mL
Weight: 130 [lb_av]

## 2017-08-21 ENCOUNTER — Encounter: Payer: Medicaid Other | Admitting: Women's Health

## 2017-08-25 ENCOUNTER — Encounter: Payer: Self-pay | Admitting: Obstetrics & Gynecology

## 2017-08-25 ENCOUNTER — Ambulatory Visit (INDEPENDENT_AMBULATORY_CARE_PROVIDER_SITE_OTHER): Payer: Medicaid Other | Admitting: Obstetrics & Gynecology

## 2017-08-25 VITALS — BP 100/66 | HR 97 | Wt 132.0 lb

## 2017-08-25 DIAGNOSIS — Z1379 Encounter for other screening for genetic and chromosomal anomalies: Secondary | ICD-10-CM

## 2017-08-25 DIAGNOSIS — Z3A17 17 weeks gestation of pregnancy: Secondary | ICD-10-CM

## 2017-08-25 DIAGNOSIS — Z3481 Encounter for supervision of other normal pregnancy, first trimester: Secondary | ICD-10-CM

## 2017-08-25 DIAGNOSIS — Z331 Pregnant state, incidental: Secondary | ICD-10-CM

## 2017-08-25 DIAGNOSIS — Z3491 Encounter for supervision of normal pregnancy, unspecified, first trimester: Secondary | ICD-10-CM

## 2017-08-25 DIAGNOSIS — Z1389 Encounter for screening for other disorder: Secondary | ICD-10-CM

## 2017-08-25 LAB — POCT URINALYSIS DIPSTICK
Blood, UA: NEGATIVE
GLUCOSE UA: NEGATIVE
KETONES UA: NEGATIVE
Nitrite, UA: NEGATIVE
PROTEIN UA: NEGATIVE

## 2017-08-25 NOTE — Progress Notes (Signed)
[id number] [redacted]w[redacted]d Estimated Date of Delivery: 02/02/18  Blood pressure 100/66, pulse 97, weight 132 lb (59.9 kg), last menstrual period 12/28/2016.   BP weight and urine results all reviewed and noted.  Please refer to the obstetrical flow sheet for the fundal height and fetal heart rate documentation:  Patient reports good fetal movement, denies any bleeding and no rupture of membranes symptoms or regular contractions. Patient is without complaints. All questions were answered.  Orders Placed This Encounter  Procedures  . US OB Comp + 14 Wk  . INTEGRATED 2  . POCT urinalysis dipstick    Plan:  Continued routine obstetrical care, 2nd IT today  Return in about 3 weeks (around 09/15/2017) for 20 week sono, LROB.

## 2017-09-01 LAB — INTEGRATED 2
AFP MARKER: 23.9 ng/mL
AFP MoM: 0.64
CROWN RUMP LENGTH: 74.3 mm
DIA MOM: 1.3
DIA Value: 233 pg/mL
Estriol, Unconjugated: 0.95 ng/mL
GEST. AGE ON COLLECTION DATE: 13.1 wk
Gestational Age: 16.9 weeks
HCG MOM: 1.25
Maternal Age at EDD: 30.5 yr
NUCHAL TRANSLUCENCY MOM: 0.77
Nuchal Translucency (NT): 1.4 mm
Number of Fetuses: 1
PAPP-A MOM: 1.77
PAPP-A Value: 2494.7 ng/mL
Test Results:: NEGATIVE
WEIGHT: 132 [lb_av]
Weight: 130 [lb_av]
hCG Value: 38.3 IU/mL
uE3 MoM: 1

## 2017-09-15 ENCOUNTER — Other Ambulatory Visit: Payer: Self-pay | Admitting: Obstetrics & Gynecology

## 2017-09-15 DIAGNOSIS — Z3491 Encounter for supervision of normal pregnancy, unspecified, first trimester: Secondary | ICD-10-CM

## 2017-09-15 DIAGNOSIS — Z363 Encounter for antenatal screening for malformations: Secondary | ICD-10-CM

## 2017-09-16 ENCOUNTER — Encounter: Payer: Self-pay | Admitting: Women's Health

## 2017-09-16 ENCOUNTER — Ambulatory Visit (INDEPENDENT_AMBULATORY_CARE_PROVIDER_SITE_OTHER): Payer: Medicaid Other

## 2017-09-16 ENCOUNTER — Ambulatory Visit (INDEPENDENT_AMBULATORY_CARE_PROVIDER_SITE_OTHER): Payer: Medicaid Other | Admitting: Women's Health

## 2017-09-16 VITALS — BP 94/60 | HR 80 | Wt 136.0 lb

## 2017-09-16 DIAGNOSIS — Z3491 Encounter for supervision of normal pregnancy, unspecified, first trimester: Secondary | ICD-10-CM

## 2017-09-16 DIAGNOSIS — Z331 Pregnant state, incidental: Secondary | ICD-10-CM

## 2017-09-16 DIAGNOSIS — Z3A20 20 weeks gestation of pregnancy: Secondary | ICD-10-CM | POA: Diagnosis not present

## 2017-09-16 DIAGNOSIS — Z363 Encounter for antenatal screening for malformations: Secondary | ICD-10-CM

## 2017-09-16 DIAGNOSIS — Z3492 Encounter for supervision of normal pregnancy, unspecified, second trimester: Secondary | ICD-10-CM

## 2017-09-16 DIAGNOSIS — Z3403 Encounter for supervision of normal first pregnancy, third trimester: Secondary | ICD-10-CM

## 2017-09-16 DIAGNOSIS — Z3482 Encounter for supervision of other normal pregnancy, second trimester: Secondary | ICD-10-CM

## 2017-09-16 DIAGNOSIS — Z1389 Encounter for screening for other disorder: Secondary | ICD-10-CM

## 2017-09-16 LAB — POCT URINALYSIS DIPSTICK
Blood, UA: NEGATIVE
GLUCOSE UA: NEGATIVE
Nitrite, UA: NEGATIVE
Protein, UA: NEGATIVE

## 2017-09-16 NOTE — Progress Notes (Signed)
   LOW-RISK PREGNANCY VISIT Patient name: Victoria Soto MRN [id number]  Date of birth: 11/24/1985 Chief Complaint:   Routine Prenatal Visit ([place] today)  History of Present Illness:   Victoria Soto is a 31 y.o. [phone] female at [redacted]w[redacted]d with an Estimated Date of Delivery: 02/02/18 being seen today for ongoing management of a low-risk pregnancy.  Today she reports dep/anx, seeing counselor at [facility], they wanted her to ask if OK to start meds- pt does feel she needs them. Denies SI/HI.  . Vag. Bleeding: None.  Movement: Present. denies leaking of fluid. Review of Systems:   Pertinent items are noted in HPI Denies abnormal vaginal discharge w/ itching/odor/irritation, headaches, visual changes, shortness of breath, chest pain, abdominal pain, severe nausea/vomiting, or problems with urination or bowel movements unless otherwise stated above. Pertinent History Reviewed:  Reviewed past medical,surgical, social, obstetrical and family history.  Reviewed problem list, medications and allergies. Physical Assessment:   Vitals:   09/16/17 1634  BP: 94/60  Pulse: 80  Weight: 136 lb (61.7 kg)  Body mass index is 23.34 kg/m.        Physical Examination:   General appearance: Well appearing, and in no distress  Mental status: Alert, oriented to person, place, and time  Skin: Warm & dry  Cardiovascular: Normal heart rate noted  Respiratory: Normal respiratory effort, no distress  Abdomen: Soft, gravid, nontender  Pelvic: Cervical exam deferred         Extremities: Edema: None  Fetal Status:     Movement: Present    [place] 20+1 wks,cephalic,cx 4.5 cm,post pl gr 0,normal ovaries bilat,fhr 153 bpm,svp of fluid 5.4 cm,efw 368 g,anatomy complete,no obvious abnormalities    Results for orders placed or performed in visit on 09/16/17 (from the past 24 hour(s))  POCT urinalysis dipstick   Collection Time: 09/16/17  4:36 PM  Result Value Ref Range   Color, UA     Clarity, UA     Glucose, UA neg    Bilirubin, UA     Ketones, UA 1+    Spec Grav, UA  1.010 - 1.025   Blood, UA neg    pH, UA  5.0 - 8.0   Protein, UA neg    Urobilinogen, UA  0.2 or 1.0 E.U./dL   Nitrite, UA neg    Leukocytes, UA Trace (A) Negative    Assessment & Plan:  1) Low-risk pregnancy [id number] at [redacted]w[redacted]d with an Estimated Date of Delivery: 02/02/18   2) Dep/anx, counseling w/ [facility], ok to start meds, pt to let us know if [facility] wants [place] to rx   Labs/procedures today: anatomy u/s  Plan:  Continue routine obstetrical care   Reviewed: Preterm labor symptoms and general obstetric precautions including but not limited to vaginal bleeding, contractions, leaking of fluid and fetal movement were reviewed in detail with the patient.  All questions were answered  Follow-up: Return in about 4 weeks (around 10/14/2017) for LROB.  Orders Placed This Encounter  Procedures  . POCT urinalysis dipstick   Marge Duncans CNM, [facility] 09/16/2017 5:16 PM

## 2017-09-16 NOTE — Progress Notes (Signed)
[place] 20+1 wks,cephalic,cx 4.5 cm,post pl gr 0,normal ovaries bilat,fhr 153 bpm,svp of fluid 5.4 cm,efw 368 g,anatomy complete,no obvious abnormalities

## 2017-09-16 NOTE — Patient Instructions (Signed)
Victoria Soto, I greatly value your feedback.  If you receive a survey following your visit with [place] today, we appreciate you taking the time to fill it out.  Thanks, Joellyn Haff, CNM, WHNP-BC   Second Trimester of Pregnancy The second trimester is from week 14 through week 27 (months 4 through 6). The second trimester is often a time when you feel your best. Your body has adjusted to being pregnant, and you begin to feel better physically. Usually, morning sickness has lessened or quit completely, you may have more energy, and you may have an increase in appetite. The second trimester is also a time when the fetus is growing rapidly. At the end of the sixth month, the fetus is about 9 inches long and weighs about 1 pounds. You will likely begin to feel the baby move (quickening) between 16 and 20 weeks of pregnancy. Body changes during your second trimester Your body continues to go through many changes during your second trimester. The changes vary from woman to woman.  Your weight will continue to increase. You will notice your lower abdomen bulging out.  You may begin to get stretch marks on your hips, abdomen, and breasts.  You may develop headaches that can be relieved by medicines. The medicines should be approved by your health care provider.  You may urinate more often because the fetus is pressing on your bladder.  You may develop or continue to have heartburn as a result of your pregnancy.  You may develop constipation because certain hormones are causing the muscles that push waste through your intestines to slow down.  You may develop hemorrhoids or swollen, bulging veins (varicose veins).  You may have back pain. This is caused by: ? Weight gain. ? Pregnancy hormones that are relaxing the joints in your pelvis. ? A shift in weight and the muscles that support your balance.  Your breasts will continue to grow and they will continue to become tender.  Your gums may bleed and  may be sensitive to brushing and flossing.  Dark spots or blotches (chloasma, mask of pregnancy) may develop on your face. This will likely fade after the baby is born.  A dark line from your belly button to the pubic area (linea nigra) may appear. This will likely fade after the baby is born.  You may have changes in your hair. These can include thickening of your hair, rapid growth, and changes in texture. Some women also have hair loss during or after pregnancy, or hair that feels dry or thin. Your hair will most likely return to normal after your baby is born.  What to expect at prenatal visits During a routine prenatal visit:  You will be weighed to make sure you and the fetus are growing normally.  Your blood pressure will be taken.  Your abdomen will be measured to track your baby's growth.  The fetal heartbeat will be listened to.  Any test results from the previous visit will be discussed.  Your health care provider may ask you:  How you are feeling.  If you are feeling the baby move.  If you have had any abnormal symptoms, such as leaking fluid, bleeding, severe headaches, or abdominal cramping.  If you are using any tobacco products, including cigarettes, chewing tobacco, and electronic cigarettes.  If you have any questions.  Other tests that may be performed during your second trimester include:  Blood tests that check for: ? Low iron levels (anemia). ? High  blood sugar that affects pregnant women (gestational diabetes) between 3 and 28 weeks. ? Rh antibodies. This is to check for a protein on red blood cells (Rh factor).  Urine tests to check for infections, diabetes, or protein in the urine.  An ultrasound to confirm the proper growth and development of the baby.  An amniocentesis to check for possible genetic problems.  Fetal screens for spina bifida and Down syndrome.  HIV (human immunodeficiency virus) testing. Routine prenatal testing includes  screening for HIV, unless you choose not to have this test.  Follow these instructions at home: Medicines  Follow your health care provider's instructions regarding medicine use. Specific medicines may be either safe or unsafe to take during pregnancy.  Take a prenatal vitamin that contains at least 600 micrograms (mcg) of folic acid.  If you develop constipation, try taking a stool softener if your health care provider approves. Eating and drinking  Eat a balanced diet that includes fresh fruits and vegetables, whole grains, good sources of protein such as meat, eggs, or tofu, and low-fat dairy. Your health care provider will help you determine the amount of weight gain that is right for you.  Avoid raw meat and uncooked cheese. These carry germs that can cause birth defects in the baby.  If you have low calcium intake from food, talk to your health care provider about whether you should take a daily calcium supplement.  Limit foods that are high in fat and processed sugars, such as fried and sweet foods.  To prevent constipation: ? Drink enough fluid to keep your urine clear or pale yellow. ? Eat foods that are high in fiber, such as fresh fruits and vegetables, whole grains, and beans. Activity  Exercise only as directed by your health care provider. Most women can continue their usual exercise routine during pregnancy. Try to exercise for 30 minutes at least 5 days a week. Stop exercising if you experience uterine contractions.  Avoid heavy lifting, wear low heel shoes, and practice good posture.  A sexual relationship may be continued unless your health care provider directs you otherwise. Relieving pain and discomfort  Wear a good support bra to prevent discomfort from breast tenderness.  Take warm sitz baths to soothe any pain or discomfort caused by hemorrhoids. Use hemorrhoid cream if your health care provider approves.  Rest with your legs elevated if you have leg cramps  or low back pain.  If you develop varicose veins, wear support hose. Elevate your feet for 15 minutes, 3-4 times a day. Limit salt in your diet. Prenatal Care  Write down your questions. Take them to your prenatal visits.  Keep all your prenatal visits as told by your health care provider. This is important. Safety  Wear your seat belt at all times when driving.  Make a list of emergency phone numbers, including numbers for family, friends, the hospital, and police and fire departments. General instructions  Ask your health care provider for a referral to a local prenatal education class. Begin classes no later than the beginning of month 6 of your pregnancy.  Ask for help if you have counseling or nutritional needs during pregnancy. Your health care provider can offer advice or refer you to specialists for help with various needs.  Do not use hot tubs, steam rooms, or saunas.  Do not douche or use tampons or scented sanitary pads.  Do not cross your legs for long periods of time.  Avoid cat litter boxes and soil  used by cats. These carry germs that can cause birth defects in the baby and possibly loss of the fetus by miscarriage or stillbirth.  Avoid all smoking, herbs, alcohol, and unprescribed drugs. Chemicals in these products can affect the formation and growth of the baby.  Do not use any products that contain nicotine or tobacco, such as cigarettes and e-cigarettes. If you need help quitting, ask your health care provider.  Visit your dentist if you have not gone yet during your pregnancy. Use a soft toothbrush to brush your teeth and be gentle when you floss. Contact a health care provider if:  You have dizziness.  You have mild pelvic cramps, pelvic pressure, or nagging pain in the abdominal area.  You have persistent nausea, vomiting, or diarrhea.  You have a bad smelling vaginal discharge.  You have pain when you urinate. Get help right away if:  You have a  fever.  You are leaking fluid from your vagina.  You have spotting or bleeding from your vagina.  You have severe abdominal cramping or pain.  You have rapid weight gain or weight loss.  You have shortness of breath with chest pain.  You notice sudden or extreme swelling of your face, hands, ankles, feet, or legs.  You have not felt your baby move in over an hour.  You have severe headaches that do not go away when you take medicine.  You have vision changes. Summary  The second trimester is from week 14 through week 27 (months 4 through 6). It is also a time when the fetus is growing rapidly.  Your body goes through many changes during pregnancy. The changes vary from woman to woman.  Avoid all smoking, herbs, alcohol, and unprescribed drugs. These chemicals affect the formation and growth your baby.  Do not use any tobacco products, such as cigarettes, chewing tobacco, and e-cigarettes. If you need help quitting, ask your health care provider.  Contact your health care provider if you have any questions. Keep all prenatal visits as told by your health care provider. This is important. This information is not intended to replace advice given to you by your health care provider. Make sure you discuss any questions you have with your health care provider. Document Released: 10/15/2001 Document Revised: 03/28/2016 Document Reviewed: 12/22/2012 Elsevier Interactive Patient Education  2017 [employer].

## 2017-10-14 ENCOUNTER — Encounter: Payer: Medicaid Other | Admitting: Obstetrics & Gynecology

## 2017-10-16 ENCOUNTER — Ambulatory Visit (INDEPENDENT_AMBULATORY_CARE_PROVIDER_SITE_OTHER): Payer: Medicaid Other | Admitting: Advanced Practice Midwife

## 2017-10-16 ENCOUNTER — Encounter: Payer: Self-pay | Admitting: Advanced Practice Midwife

## 2017-10-16 VITALS — BP 100/60 | HR 103 | Wt 136.5 lb

## 2017-10-16 DIAGNOSIS — Z3A24 24 weeks gestation of pregnancy: Secondary | ICD-10-CM

## 2017-10-16 DIAGNOSIS — Z23 Encounter for immunization: Secondary | ICD-10-CM | POA: Diagnosis not present

## 2017-10-16 DIAGNOSIS — Z1389 Encounter for screening for other disorder: Secondary | ICD-10-CM

## 2017-10-16 DIAGNOSIS — Z3482 Encounter for supervision of other normal pregnancy, second trimester: Secondary | ICD-10-CM

## 2017-10-16 DIAGNOSIS — Z331 Pregnant state, incidental: Secondary | ICD-10-CM | POA: Diagnosis not present

## 2017-10-16 LAB — POCT URINALYSIS DIPSTICK
GLUCOSE UA: NEGATIVE
Ketones, UA: NEGATIVE
LEUKOCYTES UA: NEGATIVE
NITRITE UA: NEGATIVE
PROTEIN UA: NEGATIVE
RBC UA: NEGATIVE

## 2017-10-16 NOTE — Progress Notes (Signed)
[id number] [redacted]w[redacted]d Estimated Date of Delivery: 02/02/18  Blood pressure 100/60, pulse (!) 103, weight 136 lb 8 oz (61.9 kg), last menstrual period 12/28/2016.   BP weight and urine results all reviewed and noted.  Please refer to the obstetrical flow sheet for the fundal height and fetal heart rate documentation:  Patient reports good fetal movement, denies any bleeding and no rupture of membranes symptoms or regular contractions. Patient is without complaints. All questions were answered.  Orders Placed This Encounter  Procedures  . POCT urinalysis dipstick    Plan:  Continued routine obstetrical care,   Return in about 3 weeks (around 11/06/2017) for PN2/LROB.

## 2017-10-16 NOTE — Patient Instructions (Signed)

## 2017-11-06 ENCOUNTER — Encounter: Payer: Medicaid Other | Admitting: Women's Health

## 2017-11-06 ENCOUNTER — Other Ambulatory Visit: Payer: Medicaid Other

## 2017-11-14 ENCOUNTER — Encounter: Payer: Medicaid Other | Admitting: Obstetrics & Gynecology

## 2017-11-14 ENCOUNTER — Other Ambulatory Visit: Payer: Medicaid Other

## 2017-11-14 ENCOUNTER — Other Ambulatory Visit: Payer: Medicaid Other | Admitting: Obstetrics & Gynecology

## 2017-11-20 ENCOUNTER — Encounter: Payer: Self-pay | Admitting: *Deleted

## 2017-11-20 ENCOUNTER — Other Ambulatory Visit: Payer: Medicaid Other

## 2017-11-20 ENCOUNTER — Encounter: Payer: Medicaid Other | Admitting: Obstetrics & Gynecology

## 2017-12-02 ENCOUNTER — Ambulatory Visit (INDEPENDENT_AMBULATORY_CARE_PROVIDER_SITE_OTHER): Payer: Medicaid Other | Admitting: Obstetrics & Gynecology

## 2017-12-02 ENCOUNTER — Other Ambulatory Visit: Payer: Medicaid Other

## 2017-12-02 ENCOUNTER — Encounter: Payer: Self-pay | Admitting: Obstetrics & Gynecology

## 2017-12-02 VITALS — BP 100/70 | HR 89 | Wt 140.0 lb

## 2017-12-02 DIAGNOSIS — Z3483 Encounter for supervision of other normal pregnancy, third trimester: Secondary | ICD-10-CM

## 2017-12-02 DIAGNOSIS — Z1389 Encounter for screening for other disorder: Secondary | ICD-10-CM

## 2017-12-02 DIAGNOSIS — Z331 Pregnant state, incidental: Secondary | ICD-10-CM

## 2017-12-02 DIAGNOSIS — Z3A31 31 weeks gestation of pregnancy: Secondary | ICD-10-CM

## 2017-12-02 LAB — POCT URINALYSIS DIPSTICK
Blood, UA: NEGATIVE
GLUCOSE UA: NEGATIVE
Ketones, UA: NEGATIVE
LEUKOCYTES UA: NEGATIVE
Nitrite, UA: NEGATIVE

## 2017-12-02 NOTE — Progress Notes (Signed)
[id number] [redacted]w[redacted]d Estimated Date of Delivery: 02/02/18  Blood pressure 100/70, pulse 89, weight 140 lb (63.5 kg), last menstrual period 12/28/2016.   BP weight and urine results all reviewed and noted.  Please refer to the obstetrical flow sheet for the fundal height and fetal heart rate documentation:  Patient reports good fetal movement, denies any bleeding and no rupture of membranes symptoms or regular contractions. Patient is without complaints. All questions were answered.  Orders Placed This Encounter  Procedures  . POCT urinalysis dipstick    Plan:  Continued routine obstetrical care, PN2 today, had to cancel  Return in about 2 weeks (around 12/16/2017) for LROB.

## 2017-12-03 LAB — CBC
HEMATOCRIT: 35.5 % (ref 34.0–46.6)
HEMOGLOBIN: 11.7 g/dL (ref 11.1–15.9)
MCH: 29.6 pg (ref 26.6–33.0)
MCHC: 33 g/dL (ref 31.5–35.7)
MCV: 90 fL (ref 79–97)
Platelets: 208 10*3/uL (ref 150–379)
RBC: 3.95 x10E6/uL (ref 3.77–5.28)
RDW: 13.1 % (ref 12.3–15.4)
WBC: 13.5 10*3/uL — AB (ref 3.4–10.8)

## 2017-12-03 LAB — GLUCOSE TOLERANCE, 2 HOURS W/ 1HR
Glucose, 1 hour: 79 mg/dL (ref 65–179)
Glucose, 2 hour: 75 mg/dL (ref 65–152)
Glucose, Fasting: 82 mg/dL (ref 65–91)

## 2017-12-03 LAB — ANTIBODY SCREEN: ANTIBODY SCREEN: NEGATIVE

## 2017-12-03 LAB — HIV ANTIBODY (ROUTINE TESTING W REFLEX): HIV SCREEN 4TH GENERATION: NONREACTIVE

## 2017-12-03 LAB — RPR: RPR Ser Ql: NONREACTIVE

## 2017-12-16 ENCOUNTER — Encounter: Payer: Medicaid Other | Admitting: Advanced Practice Midwife

## 2017-12-25 ENCOUNTER — Encounter: Payer: Medicaid Other | Admitting: Advanced Practice Midwife

## 2017-12-26 ENCOUNTER — Encounter: Payer: Medicaid Other | Admitting: Obstetrics and Gynecology

## 2018-01-27 ENCOUNTER — Telehealth: Payer: Self-pay | Admitting: *Deleted

## 2018-01-27 NOTE — Telephone Encounter (Signed)
Pt states that she experienced some sharp pains in the pelvic area twice yesterday that lasted about 5 min each. She states that baby is moving fine. No vaginal bleeding or leaking of fluid. Advised pt that it could be some contractions or ligament pains. Advised pt that if it continued to happen that she could go to [facility] hospital for evaluation. Pt verbalized understanding.

## 2018-02-02 ENCOUNTER — Encounter: Payer: Self-pay | Admitting: Obstetrics & Gynecology

## 2018-02-02 ENCOUNTER — Ambulatory Visit (INDEPENDENT_AMBULATORY_CARE_PROVIDER_SITE_OTHER): Payer: Medicaid Other | Admitting: Obstetrics & Gynecology

## 2018-02-02 VITALS — BP 104/66 | HR 90 | Wt 139.0 lb

## 2018-02-02 DIAGNOSIS — O480 Post-term pregnancy: Secondary | ICD-10-CM | POA: Diagnosis not present

## 2018-02-02 DIAGNOSIS — Z331 Pregnant state, incidental: Secondary | ICD-10-CM

## 2018-02-02 DIAGNOSIS — Z3A40 40 weeks gestation of pregnancy: Secondary | ICD-10-CM

## 2018-02-02 DIAGNOSIS — Z1389 Encounter for screening for other disorder: Secondary | ICD-10-CM

## 2018-02-02 LAB — POCT URINALYSIS DIPSTICK
GLUCOSE UA: NEGATIVE
KETONES UA: NEGATIVE
LEUKOCYTES UA: NEGATIVE
NITRITE UA: NEGATIVE
Protein, UA: NEGATIVE
RBC UA: NEGATIVE

## 2018-02-02 NOTE — Progress Notes (Signed)
[id number] [redacted]w[redacted]d Estimated Date of Delivery: 02/02/18  Blood pressure 104/66, pulse 90, weight 139 lb (63 kg), last menstrual period 12/28/2016.   BP weight and urine results all reviewed and noted.  Please refer to the obstetrical flow sheet for the fundal height and fetal heart rate documentation:  Patient reports good fetal movement, denies any bleeding and no rupture of membranes symptoms or regular contractions. Patient is without complaints. All questions were answered.  Orders Placed This Encounter  Procedures  . GC/Chlamydia Probe Amp([facility])  . Culture, beta strep (group b only)  . POCT Urinalysis Dipstick    Plan:  Continued routine obstetrical care, schedule induction next week 02/08/2018@2345   Return in about 6 weeks (around 03/16/2018) for post partum visit.

## 2018-02-03 ENCOUNTER — Telehealth (HOSPITAL_COMMUNITY): Payer: Self-pay | Admitting: *Deleted

## 2018-02-03 NOTE — Telephone Encounter (Signed)
Preadmission screen  

## 2018-02-05 ENCOUNTER — Other Ambulatory Visit (HOSPITAL_COMMUNITY): Payer: Self-pay | Admitting: Advanced Practice Midwife

## 2018-02-05 LAB — GC/CHLAMYDIA PROBE AMP
CHLAMYDIA, DNA PROBE: NEGATIVE
NEISSERIA GONORRHOEAE BY PCR: NEGATIVE

## 2018-02-06 LAB — CULTURE, BETA STREP (GROUP B ONLY): Strep Gp B Culture: NEGATIVE

## 2018-02-09 ENCOUNTER — Inpatient Hospital Stay (HOSPITAL_COMMUNITY)
Admission: RE | Admit: 2018-02-09 | Discharge: 2018-02-09 | Disposition: A | Discharge status: Home / Self Care | Payer: Self-pay | Source: Ambulatory Visit | Attending: Obstetrics & Gynecology | Admitting: Obstetrics & Gynecology

## 2018-02-09 NOTE — Progress Notes (Signed)
Patient called to inquire about coming for induction. Patient states she delivered at [facility] health on 02/02/18.

## 2018-03-23 ENCOUNTER — Ambulatory Visit: Payer: Medicaid Other | Admitting: Women's Health

## 2018-04-03 ENCOUNTER — Ambulatory Visit: Payer: Medicaid Other | Admitting: Women's Health

## 2018-04-03 ENCOUNTER — Encounter: Payer: Self-pay | Admitting: *Deleted

## 2018-04-27 ENCOUNTER — Encounter (HOSPITAL_COMMUNITY): Payer: Self-pay

## 2018-09-27 ENCOUNTER — Other Ambulatory Visit: Payer: Self-pay

## 2018-09-27 ENCOUNTER — Encounter (HOSPITAL_COMMUNITY): Payer: Self-pay | Admitting: Emergency Medicine

## 2018-09-27 ENCOUNTER — Emergency Department (HOSPITAL_COMMUNITY)
Admission: EM | Admit: 2018-09-27 | Discharge: 2018-09-27 | Disposition: A | Discharge status: Home / Self Care | Payer: Medicaid Other | Attending: Emergency Medicine | Admitting: Emergency Medicine

## 2018-09-27 DIAGNOSIS — R102 Pelvic and perineal pain: Secondary | ICD-10-CM | POA: Insufficient documentation

## 2018-09-27 DIAGNOSIS — F17210 Nicotine dependence, cigarettes, uncomplicated: Secondary | ICD-10-CM | POA: Diagnosis not present

## 2018-09-27 DIAGNOSIS — Z3A10 10 weeks gestation of pregnancy: Secondary | ICD-10-CM | POA: Diagnosis not present

## 2018-09-27 DIAGNOSIS — O3461 Maternal care for abnormality of vagina, first trimester: Secondary | ICD-10-CM | POA: Diagnosis not present

## 2018-09-27 DIAGNOSIS — B3731 Acute candidiasis of vulva and vagina: Secondary | ICD-10-CM

## 2018-09-27 DIAGNOSIS — B373 Candidiasis of vulva and vagina: Secondary | ICD-10-CM | POA: Diagnosis not present

## 2018-09-27 LAB — WET PREP, GENITAL
Clue Cells Wet Prep HPF POC: NONE SEEN
SPERM: NONE SEEN
Trich, Wet Prep: NONE SEEN

## 2018-09-27 LAB — URINALYSIS, ROUTINE W REFLEX MICROSCOPIC
Bilirubin Urine: NEGATIVE
Glucose, UA: NEGATIVE mg/dL
Hgb urine dipstick: NEGATIVE
KETONES UR: NEGATIVE mg/dL
LEUKOCYTES UA: NEGATIVE
Nitrite: NEGATIVE
PROTEIN: NEGATIVE mg/dL
Specific Gravity, Urine: 1.024 (ref 1.005–1.030)
pH: 7 (ref 5.0–8.0)

## 2018-09-27 LAB — I-STAT BETA HCG BLOOD, ED (MC, WL, AP ONLY): I-stat hCG, quantitative: >2000 m[IU]/mL — ABNORMAL HIGH (ref <5)

## 2018-09-27 MED ORDER — MICONAZOLE NITRATE 2 % EX CREA: 1.0000 "application " | TOPICAL_CREAM | Freq: Two times a day (BID) | CUTANEOUS | 0 refills | Status: AC

## 2018-09-27 NOTE — ED Provider Notes (Signed)
[facility] EMERGENCY DEPARTMENT Provider Note  CSN: [id number] Arrival date & time: 09/27/18 5947  Chief Complaint(s) Vaginal Itching  HPI Victoria Soto is a 32 y.o. female [id number]  [redacted]w[redacted]d by LMP 07/14/18 who had +home UPT here pelvic cramping and vaginal discharge. Cramping mostly on left adnexa. Intermittent for 2-3 days.  Currently asymptomatic.  No alleviating or aggravating factors. No vaginal bleeding. Intermittent dysuria for several weeks. Reports mild nausea. No fevers, emesis, diarrhea.  ABO O+  The history is provided by the patient.    Past Medical History Past Medical History:  Diagnosis Date  . Depression   . Headache(784.0)   . No pertinent past medical history    Patient Active Problem List   Diagnosis Date Noted  . Supervision of normal pregnancy 07/04/2017  . Depression with anxiety 07/04/2017  . Smoker 04/25/2015   Home Medication(s) Prior to Admission medications   Medication Sig Start Date End Date Taking? Authorizing Provider  miconazole (MICOTIN) 2 % cream Apply 1 application topically 2 (two) times daily for 7 days. 09/27/18 10/04/18  Nira Conn, MD                                                                                                                                    Past Surgical History Past Surgical History:  Procedure Laterality Date  . NO PAST SURGERIES     Family History Family History  Problem Relation Age of Onset  . Muscular dystrophy Other   . Hypertension Mother   . Diabetes Father   . Cirrhosis Paternal Grandfather   . Anesthesia problems Neg Hx     Social History Social History   Tobacco Use  . Smoking status: Current Every Day Smoker    Packs/day: 1.00    Years: 10.00    Pack years: 10.00    Types: Cigarettes    Last attempt to quit: 02/18/2017    Years since quitting: 1.6  . Smokeless tobacco: Never Used  Substance Use Topics  . Alcohol use: No    Alcohol/week: 0.0 standard  drinks  . Drug use: No   Allergies Patient has no known allergies.  Review of Systems Review of Systems All other systems are reviewed and are negative for acute change except as noted in the HPI  Physical Exam Vital Signs  I have reviewed the triage vital signs BP 111/73 (BP Location: Right Arm)   Pulse 74   Temp 97.9 F (36.6 C) (Oral)   Resp 18   SpO2 99%   Physical Exam  Constitutional: She is oriented to person, place, and time. She appears well-developed and well-nourished. No distress.  HENT:  Head: Normocephalic and atraumatic.  Right Ear: External ear normal.  Left Ear: External ear normal.  Nose: Nose normal.  Eyes: Conjunctivae and EOM are normal. No scleral icterus.  Neck: Normal range of motion and phonation normal.  Cardiovascular: Normal  rate and regular rhythm.  Pulmonary/Chest: Effort normal. No stridor. No respiratory distress.  Abdominal: She exhibits no distension. There is no tenderness. There is no rigidity, no rebound, no guarding and no CVA tenderness.  Genitourinary: Pelvic exam was performed with patient supine. Cervix exhibits no motion tenderness, no discharge and no friability. Right adnexum displays no tenderness. Left adnexum displays no tenderness. No erythema or bleeding in the vagina. No foreign body in the vagina. Vaginal discharge found.  Genitourinary Comments: Chaperone present during pelvic exam.  Musculoskeletal: Normal range of motion. She exhibits no edema.  Neurological: She is alert and oriented to person, place, and time.  Skin: She is not diaphoretic.  Psychiatric: She has a normal mood and affect. Her behavior is normal.  Vitals reviewed.   ED Results and Treatments Labs (all labs ordered are listed, but only abnormal results are displayed) Labs Reviewed  WET PREP, GENITAL - Abnormal; Notable for the following components:      Result Value   Yeast Wet Prep HPF POC PRESENT (*)    WBC, Wet Prep HPF POC MANY (*)    All other  components within normal limits  URINALYSIS, ROUTINE W REFLEX MICROSCOPIC - Abnormal; Notable for the following components:   APPearance HAZY (*)    All other components within normal limits  I-STAT BETA HCG BLOOD, ED ([facility], [facility], [facility] ONLY) - Abnormal; Notable for the following components:   I-stat hCG, quantitative >2,000.0 (*)    All other components within normal limits  GC/CHLAMYDIA PROBE AMP ([place]) NOT AT [facility]                                                                                                                         EKG  EKG Interpretation  Date/Time:    Ventricular Rate:    PR Interval:    QRS Duration:   QT Interval:    QTC Calculation:   R Axis:     Text Interpretation:        Radiology No results found. Pertinent labs & imaging results that were available during my care of the patient were reviewed by me and considered in my medical decision making (see chart for details).  Medications Ordered in ED Medications - No data to display  Procedures Procedures  (including critical care time)  Medical Decision Making / ED Course I have reviewed the nursing notes for this encounter and the patient's prior records (if available in EHR or on provided paperwork).      Patient reports intermittent abdominal cramping.  Currently asymptomatic and abdomen benign at this time.  Positive hCG.  10 weeks and 5 days by last menstrual period.  Unlikely ectopic pregnancy.   UA not suspicious for urinary tract infection.  Pelvic exam with vaginal discharge but not consistent with cervicitis or PID.  Wet prep positive for yeast infection.  No evidence of trichomonas or bacterial vaginosis..  GC chlamydia pending.  The patient appears reasonably screened and/or stabilized for discharge and I doubt any other medical condition or other [facility] requiring  further screening, evaluation, or treatment in the ED at this time prior to discharge.  The patient is safe for discharge with strict return precautions.   Final Clinical Impression(s) / ED Diagnoses Final diagnoses:  Yeast vaginitis  [redacted] weeks gestation of pregnancy    Disposition: Discharge  Condition: Good  I have discussed the results, Dx and Tx plan with the patient who expressed understanding and agree(s) with the plan. Discharge instructions discussed at great length. The patient was given strict return precautions who verbalized understanding of the instructions. No further questions at time of discharge.    ED Discharge Orders         Ordered    miconazole (MICOTIN) 2 % cream  2 times daily     09/27/18 0747           Follow Up: [facility] [address] [place] [address] [phone] Schedule an appointment as soon as possible for a visit  As needed for prenatal care     This chart was dictated using voice recognition software.  Despite best efforts to proofread,  errors can occur which can change the documentation meaning.   Nira Conn, MD 09/27/18 [phone]

## 2018-09-27 NOTE — ED Triage Notes (Signed)
Pt states she took a positive pregnancy test last week. LMP September. Feels like she is possibly about [redacted] weeks along. Endorses vaginal itching, discharge, and mild cramping. No spotting or blood. Pt does state she had a miscarriage last May

## 2018-09-28 LAB — GC/CHLAMYDIA PROBE AMP (~~LOC~~) NOT AT ARMC
Chlamydia: NEGATIVE
Neisseria Gonorrhea: NEGATIVE

## 2019-01-06 NOTE — Telephone Encounter (Signed)
Note sent to nurse. 

## 2019-04-03 ENCOUNTER — Inpatient Hospital Stay (HOSPITAL_COMMUNITY)
Admission: AD | Admit: 2019-04-03 | Discharge: 2019-04-03 | Disposition: A | Discharge status: Home / Self Care | Payer: Medicaid Other | Attending: Obstetrics and Gynecology | Admitting: Obstetrics and Gynecology

## 2019-04-03 ENCOUNTER — Other Ambulatory Visit: Payer: Self-pay

## 2019-04-03 ENCOUNTER — Encounter (HOSPITAL_COMMUNITY): Payer: Self-pay

## 2019-04-03 DIAGNOSIS — O0933 Supervision of pregnancy with insufficient antenatal care, third trimester: Secondary | ICD-10-CM

## 2019-04-03 DIAGNOSIS — M7989 Other specified soft tissue disorders: Secondary | ICD-10-CM | POA: Diagnosis not present

## 2019-04-03 DIAGNOSIS — F17210 Nicotine dependence, cigarettes, uncomplicated: Secondary | ICD-10-CM | POA: Insufficient documentation

## 2019-04-03 DIAGNOSIS — Z3A38 38 weeks gestation of pregnancy: Secondary | ICD-10-CM | POA: Diagnosis not present

## 2019-04-03 DIAGNOSIS — O99333 Smoking (tobacco) complicating pregnancy, third trimester: Secondary | ICD-10-CM | POA: Diagnosis not present

## 2019-04-03 DIAGNOSIS — O1203 Gestational edema, third trimester: Secondary | ICD-10-CM | POA: Diagnosis not present

## 2019-04-03 DIAGNOSIS — O26893 Other specified pregnancy related conditions, third trimester: Secondary | ICD-10-CM | POA: Diagnosis present

## 2019-04-03 LAB — URINALYSIS, ROUTINE W REFLEX MICROSCOPIC
Glucose, UA: NEGATIVE mg/dL
Hgb urine dipstick: NEGATIVE
Ketones, ur: NEGATIVE mg/dL
Nitrite: NEGATIVE
Protein, ur: NEGATIVE mg/dL
Specific Gravity, Urine: 1.013 (ref 1.005–1.030)
pH: 7 (ref 5.0–8.0)

## 2019-04-03 LAB — CBC
HCT: 37.2 % (ref 36.0–46.0)
Hemoglobin: 12.1 g/dL (ref 12.0–15.0)
MCH: 28.5 pg (ref 26.0–34.0)
MCHC: 32.5 g/dL (ref 30.0–36.0)
MCV: 87.7 fL (ref 80.0–100.0)
Platelets: 214 10*3/uL (ref 150–400)
RBC: 4.24 MIL/uL (ref 3.87–5.11)
RDW: 14.6 % (ref 11.5–15.5)
WBC: 12.5 10*3/uL — ABNORMAL HIGH (ref 4.0–10.5)
nRBC: 0 % (ref 0.0–0.2)

## 2019-04-03 LAB — RAPID URINE DRUG SCREEN, HOSP PERFORMED
Amphetamines: NOT DETECTED
Barbiturates: NOT DETECTED
Benzodiazepines: NOT DETECTED
Cocaine: NOT DETECTED
Opiates: NOT DETECTED
Tetrahydrocannabinol: NOT DETECTED

## 2019-04-03 LAB — HEMOGLOBIN A1C
Hgb A1c MFr Bld: 5 % (ref 4.8–5.6)
Mean Plasma Glucose: 96.8 mg/dL

## 2019-04-03 LAB — TYPE AND SCREEN
ABO/RH(D): O POS
Antibody Screen: NEGATIVE

## 2019-04-03 LAB — DIFFERENTIAL
Abs Immature Granulocytes: 0.37 10*3/uL — ABNORMAL HIGH (ref 0.00–0.07)
Basophils Absolute: 0 10*3/uL (ref 0.0–0.1)
Basophils Relative: 0 %
Eosinophils Absolute: 0.2 10*3/uL (ref 0.0–0.5)
Eosinophils Relative: 2 %
Immature Granulocytes: 3 %
Lymphocytes Relative: 15 %
Lymphs Abs: 1.8 10*3/uL (ref 0.7–4.0)
Monocytes Absolute: 1.2 10*3/uL — ABNORMAL HIGH (ref 0.1–1.0)
Monocytes Relative: 10 %
Neutro Abs: 8.8 10*3/uL — ABNORMAL HIGH (ref 1.7–7.7)
Neutrophils Relative %: 70 %

## 2019-04-03 LAB — HEPATITIS B SURFACE ANTIGEN: Hepatitis B Surface Ag: NEGATIVE

## 2019-04-03 LAB — WET PREP, GENITAL
Sperm: NONE SEEN
Trich, Wet Prep: NONE SEEN
Yeast Wet Prep HPF POC: NONE SEEN

## 2019-04-03 LAB — ABO/RH: ABO/RH(D): O POS

## 2019-04-03 NOTE — Discharge Instructions (Signed)

## 2019-04-03 NOTE — MAU Provider Note (Signed)
History     CSN: [id number]  Arrival date and time: 04/03/19 1235   First Provider Initiated Contact with Patient 04/03/19 1323      Chief Complaint  Patient presents with  . Foot Swelling   HPI   [name] is a 33 y.o. female [phone] @ [redacted]w[redacted]d here in MAU with bilateral feet swelling and concerns about pregnancy since she has not had any prenatal care. She has never had problems with BP in pregnancy. History of vaginal delivery X 3. Pregnancy dating by certain LMP. Recently moved to [place]. No other complaints of issues. Just concerned that the swelling in her feet could mean that she has GHTN.   OB History    Gravida  6   Para  3   Term  3   Preterm  0   AB  2   Living  2     SAB  2   TAB  0   Ectopic  0   Multiple  0   Live Births  2           Past Medical History:  Diagnosis Date  . Depression   . Headache(784.0)   . No pertinent past medical history     Past Surgical History:  Procedure Laterality Date  . NO PAST SURGERIES      Family History  Problem Relation Age of Onset  . Muscular dystrophy Other   . Hypertension Mother   . Diabetes Father   . Cirrhosis Paternal Grandfather   . Anesthesia problems Neg Hx     Social History   Tobacco Use  . Smoking status: Current Every Day Smoker    Packs/day: 1.00    Years: 10.00    Pack years: 10.00    Types: Cigarettes    Last attempt to quit: 02/18/2017    Years since quitting: 2.1  . Smokeless tobacco: Never Used  Substance Use Topics  . Alcohol use: No    Alcohol/week: 0.0 standard drinks  . Drug use: No    Allergies: No Known Allergies  No medications prior to admission.   Results for orders placed or performed during the hospital encounter of 04/03/19 (from the past 48 hour(s))  Urinalysis, Routine w reflex microscopic     Status: Abnormal   Collection Time: 04/03/19  1:11 PM  Result Value Ref Range   Color, Urine AMBER (A) YELLOW    Comment: BIOCHEMICALS MAY BE  AFFECTED BY COLOR   APPearance CLOUDY (A) CLEAR   Specific Gravity, Urine 1.013 1.005 - 1.030   pH 7.0 5.0 - 8.0   Glucose, UA NEGATIVE NEGATIVE mg/dL   Hgb urine dipstick NEGATIVE NEGATIVE   Bilirubin Urine SMALL (A) NEGATIVE   Ketones, ur NEGATIVE NEGATIVE mg/dL   Protein, ur NEGATIVE NEGATIVE mg/dL   Nitrite NEGATIVE NEGATIVE   Leukocytes,Ua MODERATE (A) NEGATIVE   RBC / HPF 0-5 0 - 5 RBC/hpf   WBC, UA 0-5 0 - 5 WBC/hpf   Bacteria, UA RARE (A) NONE SEEN   Squamous Epithelial / LPF 6-10 0 - 5   Mucus PRESENT     Comment: Performed at [facility] Lab, [address] [place], [place] [address]  Urine rapid drug screen (hosp performed)     Status: None   Collection Time: 04/03/19  1:11 PM  Result Value Ref Range   Opiates NONE DETECTED NONE DETECTED   Cocaine NONE DETECTED NONE DETECTED   Benzodiazepines NONE DETECTED NONE DETECTED  Amphetamines NONE DETECTED NONE DETECTED   Tetrahydrocannabinol NONE DETECTED NONE DETECTED   Barbiturates NONE DETECTED NONE DETECTED    Comment: (NOTE) DRUG SCREEN FOR MEDICAL PURPOSES ONLY.  IF CONFIRMATION IS NEEDED FOR ANY PURPOSE, NOTIFY LAB WITHIN 5 DAYS. LOWEST DETECTABLE LIMITS FOR URINE DRUG SCREEN Drug Class                     Cutoff (ng/mL) Amphetamine and metabolites    1000 Barbiturate and metabolites    200 Benzodiazepine                 200 Tricyclics and metabolites     300 Opiates and metabolites        300 Cocaine and metabolites        300 THC                            50 Performed at [facility] Lab, [address], [place], [place] [address]   Hemoglobin A1c     Status: None   Collection Time: 04/03/19  1:32 PM  Result Value Ref Range   Hgb A1c MFr Bld 5.0 4.8 - 5.6 %    Comment: (NOTE) Pre diabetes:          5.7%-6.4% Diabetes:              >6.4% Glycemic control for   <7.0% adults with diabetes    Mean Plasma Glucose 96.8 mg/dL    Comment: Performed at [facility] Lab, [address],  [place], [place] [address]  Hepatitis B surface antigen     Status: None   Collection Time: 04/03/19  1:32 PM  Result Value Ref Range   Hepatitis B Surface Ag Negative Negative    Comment: (NOTE) Performed At: [facility] [address] [place], [place] [id number] Jolene Schimke MD WG:[id number]   CBC     Status: Abnormal   Collection Time: 04/03/19  1:32 PM  Result Value Ref Range   WBC 12.5 (H) 4.0 - 10.5 K/uL   RBC 4.24 3.87 - 5.11 MIL/uL   Hemoglobin 12.1 12.0 - 15.0 g/dL   HCT 78.4 69.6 - 29.5 %   MCV 87.7 80.0 - 100.0 fL   MCH 28.5 26.0 - 34.0 pg   MCHC 32.5 30.0 - 36.0 g/dL   RDW 28.4 13.2 - 44.0 %   Platelets 214 150 - 400 K/uL   nRBC 0.0 0.0 - 0.2 %    Comment: Performed at [facility] Lab, [address], [place], [place] [address]  Differential     Status: Abnormal   Collection Time: 04/03/19  1:32 PM  Result Value Ref Range   Neutrophils Relative % 70 %   Neutro Abs 8.8 (H) 1.7 - 7.7 K/uL   Lymphocytes Relative 15 %   Lymphs Abs 1.8 0.7 - 4.0 K/uL   Monocytes Relative 10 %   Monocytes Absolute 1.2 (H) 0.1 - 1.0 K/uL   Eosinophils Relative 2 %   Eosinophils Absolute 0.2 0.0 - 0.5 K/uL   Basophils Relative 0 %   Basophils Absolute 0.0 0.0 - 0.1 K/uL   Immature Granulocytes 3 %   Abs Immature Granulocytes 0.37 (H) 0.00 - 0.07 K/uL    Comment: Performed at [facility] Lab, [address], [place], [place] [address]  Type and screen [facility]     Status: None  Collection Time: 04/03/19  1:32 PM  Result Value Ref Range   ABO/RH(D) O POS    Antibody Screen NEG    Sample Expiration      04/06/2019,2359 Performed at [facility] Lab, [address] [place], [place] [address]   ABO/Rh     Status: None (Preliminary result)   Collection Time: 04/03/19  1:32 PM  Result Value Ref Range   ABO/RH(D)      O POS Performed at [facility] Lab, [address], [place], [place] [address]   Wet prep, genital     Status: Abnormal    Collection Time: 04/03/19  1:50 PM  Result Value Ref Range   Yeast Wet Prep HPF POC NONE SEEN NONE SEEN   Trich, Wet Prep NONE SEEN NONE SEEN   Clue Cells Wet Prep HPF POC BILIARY STONES (A) NONE SEEN   WBC, Wet Prep HPF POC FEW (A) NONE SEEN   Sperm NONE SEEN     Comment: Performed at [facility] Lab, [address], [place], [place] [address]    Results for orders placed or performed during the hospital encounter of 04/03/19 (from the past 48 hour(s))  Urinalysis, Routine w reflex microscopic     Status: Abnormal   Collection Time: 04/03/19  1:11 PM  Result Value Ref Range   Color, Urine AMBER (A) YELLOW    Comment: BIOCHEMICALS MAY BE AFFECTED BY COLOR   APPearance CLOUDY (A) CLEAR   Specific Gravity, Urine 1.013 1.005 - 1.030   pH 7.0 5.0 - 8.0   Glucose, UA NEGATIVE NEGATIVE mg/dL   Hgb urine dipstick NEGATIVE NEGATIVE   Bilirubin Urine SMALL (A) NEGATIVE   Ketones, ur NEGATIVE NEGATIVE mg/dL   Protein, ur NEGATIVE NEGATIVE mg/dL   Nitrite NEGATIVE NEGATIVE   Leukocytes,Ua MODERATE (A) NEGATIVE   RBC / HPF 0-5 0 - 5 RBC/hpf   WBC, UA 0-5 0 - 5 WBC/hpf   Bacteria, UA RARE (A) NONE SEEN   Squamous Epithelial / LPF 6-10 0 - 5   Mucus PRESENT     Comment: Performed at [facility] Lab, [address] [place], [place] [address]  Urine rapid drug screen (hosp performed)     Status: None   Collection Time: 04/03/19  1:11 PM  Result Value Ref Range   Opiates NONE DETECTED NONE DETECTED   Cocaine NONE DETECTED NONE DETECTED   Benzodiazepines NONE DETECTED NONE DETECTED   Amphetamines NONE DETECTED NONE DETECTED   Tetrahydrocannabinol NONE DETECTED NONE DETECTED   Barbiturates NONE DETECTED NONE DETECTED    Comment: (NOTE) DRUG SCREEN FOR MEDICAL PURPOSES ONLY.  IF CONFIRMATION IS NEEDED FOR ANY PURPOSE, NOTIFY LAB WITHIN 5 DAYS. LOWEST DETECTABLE LIMITS FOR URINE DRUG SCREEN Drug Class                     Cutoff (ng/mL) Amphetamine and metabolites     1000 Barbiturate and metabolites    200 Benzodiazepine                 200 Tricyclics and metabolites     300 Opiates and metabolites        300 Cocaine and metabolites        300 THC                            50 Performed at [facility] Lab, [address], [place],  [place] [address]   Hemoglobin A1c     Status: None   Collection Time: 04/03/19  1:32 PM  Result Value Ref Range   Hgb A1c MFr Bld 5.0 4.8 - 5.6 %    Comment: (NOTE) Pre diabetes:          5.7%-6.4% Diabetes:              >6.4% Glycemic control for   <7.0% adults with diabetes    Mean Plasma Glucose 96.8 mg/dL    Comment: Performed at [facility] Lab, [address] [place], [place] [address]  Hepatitis B surface antigen     Status: None   Collection Time: 04/03/19  1:32 PM  Result Value Ref Range   Hepatitis B Surface Ag Negative Negative    Comment: (NOTE) Performed At: [facility] [address] [place], [place] [id number] Jolene Schimke MD FA:[id number]   CBC     Status: Abnormal   Collection Time: 04/03/19  1:32 PM  Result Value Ref Range   WBC 12.5 (H) 4.0 - 10.5 K/uL   RBC 4.24 3.87 - 5.11 MIL/uL   Hemoglobin 12.1 12.0 - 15.0 g/dL   HCT 69.6 29.5 - 28.4 %   MCV 87.7 80.0 - 100.0 fL   MCH 28.5 26.0 - 34.0 pg   MCHC 32.5 30.0 - 36.0 g/dL   RDW 13.2 44.0 - 10.2 %   Platelets 214 150 - 400 K/uL   nRBC 0.0 0.0 - 0.2 %    Comment: Performed at [facility] Lab, [address], [place], [place] [address]  Differential     Status: Abnormal   Collection Time: 04/03/19  1:32 PM  Result Value Ref Range   Neutrophils Relative % 70 %   Neutro Abs 8.8 (H) 1.7 - 7.7 K/uL   Lymphocytes Relative 15 %   Lymphs Abs 1.8 0.7 - 4.0 K/uL   Monocytes Relative 10 %   Monocytes Absolute 1.2 (H) 0.1 - 1.0 K/uL   Eosinophils Relative 2 %   Eosinophils Absolute 0.2 0.0 - 0.5 K/uL   Basophils Relative 0 %   Basophils Absolute 0.0 0.0 - 0.1 K/uL   Immature Granulocytes 3 %   Abs Immature Granulocytes  0.37 (H) 0.00 - 0.07 K/uL    Comment: Performed at [facility] Lab, [address] [place], [place] [address]  Type and screen [facility]     Status: None   Collection Time: 04/03/19  1:32 PM  Result Value Ref Range   ABO/RH(D) O POS    Antibody Screen NEG    Sample Expiration      04/06/2019,2359 Performed at [facility] Lab, [address], [place], [place] [address]   ABO/Rh     Status: None (Preliminary result)   Collection Time: 04/03/19  1:32 PM  Result Value Ref Range   ABO/RH(D)      O POS Performed at [facility] Lab, [address], [place], [place] [address]   Wet prep, genital     Status: Abnormal   Collection Time: 04/03/19  1:50 PM  Result Value Ref Range   Yeast Wet Prep HPF POC NONE SEEN NONE SEEN   Trich, Wet Prep NONE SEEN NONE SEEN   Clue Cells Wet Prep HPF POC BILIARY STONES (A) NONE SEEN   WBC, Wet Prep HPF POC FEW (A) NONE SEEN   Sperm NONE SEEN     Comment: Performed at [facility]  Lab, [address] [place], [place] [id number]    Review of Systems  Eyes: Negative for photophobia.  Neurological: Negative for headaches.   Physical Exam   Blood pressure 102/75, pulse 89, temperature (!) 97.5 F (36.4 C), temperature source Oral, resp. rate 18, height 5\' 4"  (1.626 m), weight 62.6 kg, last menstrual period 07/11/2018, SpO2 97 %, unknown if currently breastfeeding.   Patient Vitals for the past 24 hrs:  BP Temp Temp src Pulse Resp SpO2 Height Weight  04/03/19 1330 102/75 - - 89 - 97 % - -  04/03/19 1315 101/74 - - (!) 101 - 97 % - -  04/03/19 1248 105/66 (!) 97.5 F (36.4 C) Oral (!) 110 18 100 % - -  04/03/19 1243 - - - - - - 5\' 4"  (1.626 m) 62.6 kg    Physical Exam  Constitutional: She is oriented to person, place, and time. She appears well-developed and well-nourished. No distress.  HENT:  Head: Normocephalic.  Eyes: Pupils are equal, round, and reactive to light.  Neck: Neck supple.  Musculoskeletal: Normal  range of motion.        General: Edema (1+ edema ) present.  Neurological: She is alert and oriented to person, place, and time. She displays normal reflexes.  Skin: Skin is warm. She is not diaphoretic.  Psychiatric: Her behavior is normal.   Fetal Tracing: Baseline: 145 bpm Variability: Moderate  Accelerations: 15x15 Decelerations: none Toco: none   MAU Course  Procedures None  MDM  Prenatal panel collected GBS, Wet prep and GC collected NST reactive Patient scheduled for a new OB virtual visit on Monday at 2:35; Labs and cultures completed in MAU MFM US ordered   Assessment and Plan   A:  1. No prenatal care in current pregnancy in third trimester   2. Bilateral swelling of feet   3. [redacted] weeks gestation of pregnancy     P:  Discharge home in stable condition OB visit on Monday US ordered Return to MAU if symptoms worsen Labor precautions  Zae Kirtz, Harolyn RutherfordJennifer I, NP 04/03/2019 5:26 PM

## 2019-04-03 NOTE — MAU Note (Signed)
Victoria Soto is a 33 y.o. at [redacted]w[redacted]d here in MAU reporting: states she has not had any prenatal care and wants to make sure everything is okay with the baby. States she noticed her feet started swelling the past month. No abdominal pain currently, no bleeding, no LOF, + FM. Last period was in September. States she has also been having really bad indigestion.   Onset of complaint: ongoing  Pain score: 0/10  Vitals:   04/03/19 1248  BP: 105/66  Pulse: (!) 110  Resp: 18  Temp: (!) 97.5 F (36.4 C)  SpO2: 100%     FHT:141  Lab orders placed from triage: UA

## 2019-04-04 LAB — CULTURE, OB URINE: Culture: NO GROWTH

## 2019-04-04 LAB — HIV ANTIBODY (ROUTINE TESTING W REFLEX): HIV Screen 4th Generation wRfx: NONREACTIVE

## 2019-04-05 ENCOUNTER — Ambulatory Visit: Payer: Medicaid Other

## 2019-04-05 LAB — GC/CHLAMYDIA PROBE AMP (~~LOC~~) NOT AT ARMC
Chlamydia: NEGATIVE
Neisseria Gonorrhea: NEGATIVE

## 2019-04-05 LAB — CULTURE, BETA STREP (GROUP B ONLY)

## 2019-04-05 LAB — RPR: RPR Ser Ql: NONREACTIVE

## 2019-04-05 LAB — OB RESULTS CONSOLE GBS: GBS: POSITIVE

## 2019-04-05 LAB — RUBELLA SCREEN: Rubella: 3.41 index (ref >0.99)

## 2019-04-06 ENCOUNTER — Telehealth: Payer: Self-pay

## 2019-04-06 ENCOUNTER — Telehealth: Payer: Self-pay | Admitting: Nurse Practitioner

## 2019-04-06 NOTE — Telephone Encounter (Signed)
The patient called in regarding the missed appointment. Rescheduled

## 2019-04-06 NOTE — Telephone Encounter (Signed)
Attempted to call patient to inform her about her appointment time change on 6/3 from 1:55 to 1:35. No answer, left voicemail with this appointment time change and advised to give the office a call if she had any questions or concerns.

## 2019-04-07 ENCOUNTER — Ambulatory Visit: Payer: Medicaid Other | Admitting: Family Medicine

## 2019-04-07 ENCOUNTER — Encounter: Payer: Self-pay | Admitting: Nurse Practitioner

## 2019-04-07 ENCOUNTER — Other Ambulatory Visit: Payer: Self-pay

## 2019-04-07 ENCOUNTER — Telehealth: Payer: Self-pay | Admitting: Nurse Practitioner

## 2019-04-07 ENCOUNTER — Telehealth: Payer: Medicaid Other | Admitting: Nurse Practitioner

## 2019-04-07 NOTE — Telephone Encounter (Signed)
Attempted to call patient to get her rescheduled for her missed new ob appointment. No answer, left voicemail to give the office a call back to be rescheduled. No show letter also mailed.

## 2019-04-07 NOTE — Progress Notes (Unsigned)
1:36- Called pt for My Chart Visit, no answer, left VM that will call back about 10 to 15 mins.   1:49p- 2nd attempt to call pt , no call, left VM that appt. Will need to be rescheduled.

## 2019-04-12 ENCOUNTER — Other Ambulatory Visit: Payer: Self-pay

## 2019-04-12 ENCOUNTER — Encounter (HOSPITAL_COMMUNITY): Payer: Self-pay | Admitting: Emergency Medicine

## 2019-04-12 ENCOUNTER — Emergency Department (HOSPITAL_COMMUNITY)
Admission: EM | Admit: 2019-04-12 | Discharge: 2019-04-12 | Disposition: A | Discharge status: Home / Self Care | Payer: Medicaid Other | Attending: Emergency Medicine | Admitting: Emergency Medicine

## 2019-04-12 DIAGNOSIS — H9202 Otalgia, left ear: Secondary | ICD-10-CM | POA: Diagnosis present

## 2019-04-12 DIAGNOSIS — Z3490 Encounter for supervision of normal pregnancy, unspecified, unspecified trimester: Secondary | ICD-10-CM

## 2019-04-12 DIAGNOSIS — F17210 Nicotine dependence, cigarettes, uncomplicated: Secondary | ICD-10-CM | POA: Insufficient documentation

## 2019-04-12 DIAGNOSIS — I219 Acute myocardial infarction, unspecified: Secondary | ICD-10-CM | POA: Insufficient documentation

## 2019-04-12 MED ORDER — AMOXICILLIN 500 MG PO CAPS: 500.0000 mg | ORAL_CAPSULE | Freq: Two times a day (BID) | ORAL | 0 refills | Status: DC

## 2019-04-12 MED ORDER — SODIUM CHLORIDE 0.9 % IV BOLUS (SEPSIS)
1000.0000 mL | Freq: Once | INTRAVENOUS | Status: AC
  Administered 2019-04-12: 04:00:00 1000 mL via INTRAVENOUS

## 2019-04-12 NOTE — ED Triage Notes (Signed)
Pt reports having left ear pain yesterday morning with drainage and has been having vomiting for the last several days. Pt reports due date of 04/20/2019

## 2019-04-12 NOTE — Discharge Instructions (Signed)
You will need to go to to the [facility] at [facility] for your delivery.

## 2019-04-12 NOTE — ED Provider Notes (Signed)
[facility] DEPT Provider Note   CSN: [id number] Arrival date & time: 04/12/19  0136    History   Chief Complaint Chief Complaint  Patient presents with  . emesis in pregnancy  . Otalgia    HPI Victoria Soto is a 33 y.o. female.     The history is provided by the patient.  Otalgia  Location:  Left Quality:  Aching Severity:  Moderate Onset quality:  Sudden Duration:  2 days Timing:  Constant Progression:  Worsening Chronicity:  New Relieved by:  Nothing Worsened by:  Nothing Associated symptoms: ear discharge and vomiting   Associated symptoms: no congestion and no fever   She reports waking up with 24 hours ago with pain in her left ear.  Denies trauma.  Her husband tried extracting earwax, then her ear began to bleed and the pain worsened She reports due to ear pain she has been having vomiting.  She also reports she is in her third trimester of pregnancy.  She has 4 previous deliveries. She reports she has not had any prenatal care this pregnancy. Expected due date is June 16 She reports some decreased fetal movement.  She reports mild abdominal cramping that she feels is Braxton Hicks contractions No vaginal bleeding or loss of fluid.   Past Medical History:  Diagnosis Date  . Depression   . Headache(784.0)   . No pertinent past medical history     Patient Active Problem List   Diagnosis Date Noted  . Supervision of normal pregnancy 07/04/2017  . Depression with anxiety 07/04/2017  . Smoker 04/25/2015    Past Surgical History:  Procedure Laterality Date  . NO PAST SURGERIES       OB History    Gravida  6   Para  3   Term  3   Preterm  0   AB  2   Living  2     SAB  2   TAB  0   Ectopic  0   Multiple  0   Live Births  2            Home Medications    Prior to Admission medications   Medication Sig Start Date End Date Taking? Authorizing Provider  amoxicillin (AMOXIL) 500 MG capsule Take  1 capsule (500 mg total) by mouth 2 (two) times daily. 04/12/19   Ripley Fraise, MD    Family History Family History  Problem Relation Age of Onset  . Muscular dystrophy Other   . Hypertension Mother   . Diabetes Father   . Cirrhosis Paternal Grandfather   . Anesthesia problems Neg Hx     Social History Social History   Tobacco Use  . Smoking status: Current Every Day Smoker    Packs/day: 1.00    Years: 10.00    Pack years: 10.00    Types: Cigarettes    Last attempt to quit: 02/18/2017    Years since quitting: 2.1  . Smokeless tobacco: Never Used  Substance Use Topics  . Alcohol use: No    Alcohol/week: 0.0 standard drinks  . Drug use: No     Allergies   Patient has no known allergies.   Review of Systems Review of Systems  Constitutional: Negative for fever.  HENT: Positive for ear discharge and ear pain. Negative for congestion.   Gastrointestinal: Positive for nausea and vomiting.  Genitourinary: Negative for vaginal bleeding and vaginal discharge.  All other systems reviewed and are negative.  Physical Exam Updated Vital Signs BP (!) 122/94 (BP Location: Left Arm)   Pulse (!) 112   Temp 98.6 F (37 C) (Oral)   Resp 20   Ht 1.626 m (5\' 4" )   Wt 62.6 kg   LMP 07/11/2018   SpO2 99%   BMI 23.69 kg/m   Physical Exam CONSTITUTIONAL: Anxious, no acute distress HEAD: Normocephalic/atraumatic EYES: EOMI/PERRL ENMT: Mucous membranes moist, right TM intact.  Left TM is obscured by blood in ear canal. NECK: supple no meningeal signs SPINE/BACK:entire spine nontender CV: S1/S2 noted, no murmurs/rubs/gallops noted LUNGS: Lungs are clear to auscultation bilaterally, no apparent distress ABDOMEN: soft, nontender, no rebound or guarding, bowel sounds noted throughout abdomen, gravid  GU:no cva tenderness NEURO: Pt is awake/alert/appropriate, moves all extremitiesx4.  No facial droop.   EXTREMITIES: pulses normal/equal, full ROM SKIN: warm, color normal  PSYCH: no abnormalities of mood noted, alert and oriented to situation   ED Treatments / Results  Labs (all labs ordered are listed, but only abnormal results are displayed) Labs Reviewed - No data to display  EKG None  Radiology No results found.  Procedures Procedures (including critical care time)  Medications Ordered in ED Medications  sodium chloride 0.9 % bolus 1,000 mL (0 mLs Intravenous Stopped 04/12/19 0456)     Initial Impression / Assessment and Plan / ED Course  I have reviewed the triage vital signs and the nursing notes.         Patient presented for left ear pain and vomiting, but then reports she is in her third trimester pregnancy was no prenatal care IV fluids been ordered.  The rapid OB nurse has been consulted.  Currently fetal heart tones in the 130s.  Patient is in no acute distress   For ear pain, advised her to not stick anything else in her left ear, will place her on antibiotics in case there is any infection.  Unclear if this is a ruptured TM versus  injury to the ear canal   Patient requesting discharge.  She was seen by the rapid OB nurse, was not felt to be an labor.  Apparently she was seen recently at the MAU at the women Center Patient is a very poor historian, was not forthright in her initial history and demanded discharge.  Final Clinical Impressions(s) / ED Diagnoses   Final diagnoses:  Left ear pain  Pregnancy, unspecified gestational age    ED Discharge Orders         Ordered    amoxicillin (AMOXIL) 500 MG capsule  2 times daily     04/12/19 0321           Zadie Rhine, MD 04/12/19 [phone]

## 2019-04-12 NOTE — Progress Notes (Signed)
Patient is a G6P3 at [place] by LMP. She has had no prenatal care, except for an MAU visit on 5/30. Her initial complaint tonight was an earache, but then said she was contracting and might be in labor. No LOF or bleeding.  Fetal heart tones 130bpm, reactive with multiple 15x15bpm accelerations and no decelerations.  Contracting every 8-10 minutes, mild by palpation, patient does not appear aware when happening.   SVE 2/50/-2.  Dr Roselie Awkward notified of patient at [facility]. OK to discharge home from an obstetric standpoint.

## 2019-04-12 NOTE — ED Notes (Signed)
Rapid OB response called. Will arrive in approx 20 min.

## 2019-04-14 ENCOUNTER — Telehealth: Payer: Self-pay

## 2019-04-14 NOTE — Telephone Encounter (Signed)
The patient called in to reschedule the appointment. The patient missed the last two appointments. She is 38 weeks with no prenatal care. The labs were completed in MAU the visit is ok to be virtual. Her estimated due date is 6/13. Placed on the schedule with the only available appointment due to the circumstance.    Informed of how to download the mychart app. Stated she does not remember her username and password. Informed the patient of the mychart help desk number.

## 2019-04-15 ENCOUNTER — Telehealth (INDEPENDENT_AMBULATORY_CARE_PROVIDER_SITE_OTHER): Payer: Medicaid Other | Admitting: Obstetrics & Gynecology

## 2019-04-15 ENCOUNTER — Other Ambulatory Visit (HOSPITAL_COMMUNITY): Payer: Self-pay | Admitting: Advanced Practice Midwife

## 2019-04-15 ENCOUNTER — Other Ambulatory Visit: Payer: Self-pay

## 2019-04-15 ENCOUNTER — Encounter: Payer: Self-pay | Admitting: Obstetrics & Gynecology

## 2019-04-15 ENCOUNTER — Other Ambulatory Visit: Payer: Self-pay | Admitting: Obstetrics & Gynecology

## 2019-04-15 ENCOUNTER — Ambulatory Visit (HOSPITAL_COMMUNITY)
Admission: RE | Admit: 2019-04-15 | Discharge: 2019-04-15 | Disposition: A | Discharge status: Home / Self Care | Payer: Medicaid Other | Source: Ambulatory Visit | Attending: Obstetrics and Gynecology | Admitting: Obstetrics and Gynecology

## 2019-04-15 DIAGNOSIS — O0933 Supervision of pregnancy with insufficient antenatal care, third trimester: Secondary | ICD-10-CM

## 2019-04-15 DIAGNOSIS — Z3483 Encounter for supervision of other normal pregnancy, third trimester: Secondary | ICD-10-CM

## 2019-04-15 DIAGNOSIS — Z3A39 39 weeks gestation of pregnancy: Secondary | ICD-10-CM

## 2019-04-15 DIAGNOSIS — O0930 Supervision of pregnancy with insufficient antenatal care, unspecified trimester: Secondary | ICD-10-CM | POA: Insufficient documentation

## 2019-04-15 NOTE — Patient Instructions (Signed)

## 2019-04-15 NOTE — Progress Notes (Signed)
Pt was informed of the ultrasound scheduled for 04/15/19 @ 2:30. Pt was instructed to arrive at 2:15. Pt had no further questions or concerns and verbalized understanding.

## 2019-04-15 NOTE — Progress Notes (Signed)
   TELEHEALTH VIRTUAL OBSTETRICS VISIT ENCOUNTER NOTE  I connected with Victoria Soto on 04/15/19 at 10:15 AM EDT by telephone at home and verified that I am speaking with the correct person using two identifiers.   I discussed the limitations, risks, security and privacy concerns of performing an evaluation and management service by telephone and the availability of in person appointments. I also discussed with the patient that there may be a patient responsible charge related to this service. The patient expressed understanding and agreed to proceed.  Subjective:  Victoria Soto is a 33 y.o. [phone] at [redacted]w[redacted]d being followed for ongoing prenatal care.  She is currently monitored for the following issues for this high-risk pregnancy and has Smoker; Supervision of normal pregnancy; Depression with anxiety; and Late prenatal care affecting pregnancy, antepartum on their problem list.  Patient reports occasional contractions. Reports fetal movement. Denies any contractions, bleeding or leaking of fluid.   The following portions of the patient's history were reviewed and updated as appropriate: allergies, current medications, past family history, past medical history, past social history, past surgical history and problem list.   Objective:   General:  Alert, oriented and cooperative.   Mental Status: Normal mood and affect perceived. Normal judgment and thought content.  Rest of physical exam deferred due to type of encounter  Assessment and Plan:  Pregnancy: [id number] at [redacted]w[redacted]d   2. Late prenatal care affecting pregnancy, antepartum Needs [place], IOL 41 weeks. BP needs check for possible GHTN  Term labor symptoms and general obstetric precautions including but not limited to vaginal bleeding, contractions, leaking of fluid and fetal movement were reviewed in detail with the patient.  I discussed the assessment and treatment plan with the patient. The patient was provided an opportunity to ask  questions and all were answered. The patient agreed with the plan and demonstrated an understanding of the instructions. The patient was advised to call back or seek an in-person office evaluation/go to MAU at [facility] for any urgent or concerning symptoms. Please refer to After Visit Summary for other counseling recommendations.   I provided 15 minutes of non-face-to-face time during this encounter.  No follow-ups on file.  Future Appointments  Date Time Provider [place]  04/24/2019  7:30 AM [facility] [place] None    Emeterio Reeve, [place] for [place], [place]

## 2019-04-15 NOTE — Progress Notes (Signed)
IOL orders 

## 2019-04-16 ENCOUNTER — Other Ambulatory Visit: Payer: Self-pay

## 2019-04-16 ENCOUNTER — Encounter (HOSPITAL_COMMUNITY): Payer: Self-pay

## 2019-04-16 ENCOUNTER — Inpatient Hospital Stay (HOSPITAL_COMMUNITY)
Admission: AD | Admit: 2019-04-16 | Discharge: 2019-04-18 | DRG: 776 | Disposition: A | Discharge status: Home / Self Care | Payer: Medicaid Other | Attending: Obstetrics and Gynecology | Admitting: Obstetrics and Gynecology

## 2019-04-16 DIAGNOSIS — Z5900 Homelessness unspecified: Secondary | ICD-10-CM

## 2019-04-16 DIAGNOSIS — Z1159 Encounter for screening for other viral diseases: Secondary | ICD-10-CM

## 2019-04-16 DIAGNOSIS — F419 Anxiety disorder, unspecified: Secondary | ICD-10-CM | POA: Diagnosis present

## 2019-04-16 DIAGNOSIS — O99325 Drug use complicating the puerperium: Principal | ICD-10-CM | POA: Diagnosis present

## 2019-04-16 DIAGNOSIS — F53 Postpartum depression: Secondary | ICD-10-CM | POA: Diagnosis present

## 2019-04-16 DIAGNOSIS — F329 Major depressive disorder, single episode, unspecified: Secondary | ICD-10-CM | POA: Diagnosis present

## 2019-04-16 DIAGNOSIS — F1110 Opioid abuse, uncomplicated: Secondary | ICD-10-CM | POA: Diagnosis present

## 2019-04-16 DIAGNOSIS — B1920 Unspecified viral hepatitis C without hepatic coma: Secondary | ICD-10-CM | POA: Diagnosis present

## 2019-04-16 DIAGNOSIS — F17210 Nicotine dependence, cigarettes, uncomplicated: Secondary | ICD-10-CM | POA: Diagnosis present

## 2019-04-16 DIAGNOSIS — O99335 Smoking (tobacco) complicating the puerperium: Secondary | ICD-10-CM | POA: Diagnosis present

## 2019-04-16 DIAGNOSIS — O9843 Viral hepatitis complicating the puerperium: Secondary | ICD-10-CM | POA: Diagnosis present

## 2019-04-16 DIAGNOSIS — O99345 Other mental disorders complicating the puerperium: Secondary | ICD-10-CM | POA: Diagnosis present

## 2019-04-16 DIAGNOSIS — O99320 Drug use complicating pregnancy, unspecified trimester: Secondary | ICD-10-CM | POA: Diagnosis present

## 2019-04-16 DIAGNOSIS — F1990 Other psychoactive substance use, unspecified, uncomplicated: Secondary | ICD-10-CM | POA: Diagnosis present

## 2019-04-16 DIAGNOSIS — F32A Depression, unspecified: Secondary | ICD-10-CM | POA: Diagnosis present

## 2019-04-16 DIAGNOSIS — Z23 Encounter for immunization: Secondary | ICD-10-CM | POA: Diagnosis not present

## 2019-04-16 HISTORY — DX: Opioid abuse, uncomplicated: F11.10

## 2019-04-16 HISTORY — DX: Anxiety disorder, unspecified: F41.9

## 2019-04-16 HISTORY — DX: Other psychoactive substance use, unspecified, uncomplicated: F19.90

## 2019-04-16 HISTORY — DX: Homelessness unspecified: Z59.00

## 2019-04-16 LAB — SARS CORONAVIRUS 2: SARS Coronavirus 2: NOT DETECTED

## 2019-04-16 LAB — RAPID URINE DRUG SCREEN, HOSP PERFORMED
Amphetamines: NOT DETECTED
Barbiturates: NOT DETECTED
Benzodiazepines: NOT DETECTED
Cocaine: NOT DETECTED
Opiates: POSITIVE — AB
Tetrahydrocannabinol: NOT DETECTED

## 2019-04-16 LAB — COMPREHENSIVE METABOLIC PANEL
ALT: 44 U/L (ref 0–44)
AST: 92 U/L — ABNORMAL HIGH (ref 15–41)
Albumin: 1.8 g/dL — ABNORMAL LOW (ref 3.5–5.0)
Alkaline Phosphatase: 215 U/L — ABNORMAL HIGH (ref 38–126)
Anion gap: 8 (ref 5–15)
BUN: 6 mg/dL (ref 6–20)
CO2: 25 mmol/L (ref 22–32)
Calcium: 8.6 mg/dL — ABNORMAL LOW (ref 8.9–10.3)
Chloride: 106 mmol/L (ref 98–111)
Creatinine, Ser: 0.71 mg/dL (ref 0.44–1.00)
GFR calc Af Amer: >60 mL/min (ref >60)
GFR calc non Af Amer: >60 mL/min (ref >60)
Glucose, Bld: 86 mg/dL (ref 70–99)
Potassium: 3.6 mmol/L (ref 3.5–5.1)
Sodium: 139 mmol/L (ref 135–145)
Total Bilirubin: 5.3 mg/dL — ABNORMAL HIGH (ref 0.3–1.2)
Total Protein: 4.8 g/dL — ABNORMAL LOW (ref 6.5–8.1)

## 2019-04-16 LAB — CBC
HCT: 33.1 % — ABNORMAL LOW (ref 36.0–46.0)
Hemoglobin: 10.5 g/dL — ABNORMAL LOW (ref 12.0–15.0)
MCH: 27.5 pg (ref 26.0–34.0)
MCHC: 31.7 g/dL (ref 30.0–36.0)
MCV: 86.6 fL (ref 80.0–100.0)
Platelets: 187 10*3/uL (ref 150–400)
RBC: 3.82 MIL/uL — ABNORMAL LOW (ref 3.87–5.11)
RDW: 16 % — ABNORMAL HIGH (ref 11.5–15.5)
WBC: 14.9 10*3/uL — ABNORMAL HIGH (ref 4.0–10.5)
nRBC: 0 % (ref 0.0–0.2)

## 2019-04-16 MED ORDER — ONDANSETRON HCL 4 MG PO TABS: 4.0000 mg | ORAL_TABLET | ORAL | Status: DC | PRN

## 2019-04-16 MED ORDER — AMOXICILLIN 500 MG PO CAPS
500.0000 mg | ORAL_CAPSULE | Freq: Two times a day (BID) | ORAL | Status: DC
  Administered 2019-04-16 – 2019-04-18 (×4): 500 mg via ORAL
  Filled 2019-04-16 (×7): qty 1

## 2019-04-16 MED ORDER — SOD CITRATE-CITRIC ACID 500-334 MG/5ML PO SOLN: 30.0000 mL | ORAL | Status: DC | PRN

## 2019-04-16 MED ORDER — BENZOCAINE-MENTHOL 20-0.5 % EX AERO
1.0000 "application " | INHALATION_SPRAY | CUTANEOUS | Status: DC | PRN
  Filled 2019-04-16: qty 56

## 2019-04-16 MED ORDER — TETANUS-DIPHTH-ACELL PERTUSSIS 5-2.5-18.5 LF-MCG/0.5 IM SUSP
0.5000 mL | Freq: Once | INTRAMUSCULAR | Status: AC
  Administered 2019-04-17: 0.5 mL via INTRAMUSCULAR
  Filled 2019-04-16: qty 0.5

## 2019-04-16 MED ORDER — MEDROXYPROGESTERONE ACETATE 150 MG/ML IM SUSP
150.0000 mg | INTRAMUSCULAR | Status: AC | PRN
  Administered 2019-04-18: 150 mg via INTRAMUSCULAR
  Filled 2019-04-16: qty 1

## 2019-04-16 MED ORDER — MISOPROSTOL 25 MCG QUARTER TABLET: 25.0000 ug | ORAL_TABLET | ORAL | Status: DC | PRN

## 2019-04-16 MED ORDER — ONDANSETRON HCL 4 MG/2ML IJ SOLN: 4.0000 mg | INTRAMUSCULAR | Status: DC | PRN

## 2019-04-16 MED ORDER — IBUPROFEN 600 MG PO TABS
600.0000 mg | ORAL_TABLET | Freq: Four times a day (QID) | ORAL | Status: DC
  Administered 2019-04-16 – 2019-04-18 (×9): 600 mg via ORAL
  Filled 2019-04-16 (×9): qty 1

## 2019-04-16 MED ORDER — DOCUSATE SODIUM 100 MG PO CAPS
100.0000 mg | ORAL_CAPSULE | Freq: Two times a day (BID) | ORAL | Status: DC
  Administered 2019-04-16 – 2019-04-17 (×3): 100 mg via ORAL
  Filled 2019-04-16 (×4): qty 1

## 2019-04-16 MED ORDER — SIMETHICONE 80 MG PO CHEW: 80.0000 mg | CHEWABLE_TABLET | ORAL | Status: DC | PRN

## 2019-04-16 MED ORDER — PRENATAL MULTIVITAMIN CH
1.0000 | ORAL_TABLET | Freq: Every day | ORAL | Status: DC
  Administered 2019-04-16 – 2019-04-18 (×3): 1 via ORAL
  Filled 2019-04-16 (×2): qty 1

## 2019-04-16 MED ORDER — WITCH HAZEL-GLYCERIN EX PADS
1.0000 "application " | MEDICATED_PAD | CUTANEOUS | Status: DC | PRN
  Administered 2019-04-17: 1 via TOPICAL

## 2019-04-16 MED ORDER — COCONUT OIL OIL: 1.0000 "application " | TOPICAL_OIL | Status: DC | PRN

## 2019-04-16 MED ORDER — DIPHENHYDRAMINE HCL 25 MG PO CAPS: 25.0000 mg | ORAL_CAPSULE | Freq: Four times a day (QID) | ORAL | Status: DC | PRN

## 2019-04-16 MED ORDER — ACETAMINOPHEN 325 MG PO TABS: 650.0000 mg | ORAL_TABLET | ORAL | Status: DC | PRN

## 2019-04-16 MED ORDER — MEASLES, MUMPS & RUBELLA VAC IJ SOLR: 0.5000 mL | Freq: Once | INTRAMUSCULAR | Status: DC

## 2019-04-16 MED ORDER — TERBUTALINE SULFATE 1 MG/ML IJ SOLN: 0.2500 mg | Freq: Once | INTRAMUSCULAR | Status: DC | PRN

## 2019-04-16 MED ORDER — DIBUCAINE (PERIANAL) 1 % EX OINT
1.0000 "application " | TOPICAL_OINTMENT | CUTANEOUS | Status: DC | PRN
  Administered 2019-04-17: 1 via RECTAL
  Filled 2019-04-16: qty 28

## 2019-04-16 NOTE — Discharge Summary (Signed)
Postpartum Discharge Summary     Patient Name: CARITA SOLLARS DOB: [birth year] MRN: [id number]  Date of admission: 04/16/2019 Delivering Provider:    Date of discharge: 04/18/2019  Admitting diagnosis: pregnancy Intrauterine pregnancy: [redacted]w[redacted]d     Secondary diagnosis:  Active Problems:   Heroin abuse ([place])   Vaginal delivery   IVDU (intravenous drug user)   Homeless   Heroin abuse affecting pregnancy ([place])   Depression   Anxiety  Additional problems: unintentional home birth     Discharge diagnosis: Term Pregnancy Delivered                                                                                                Post partum procedures:Depo Provera  Augmentation:   Complications: None  Hospital course:  Delivered at home, placenta in EMS  Magnesium Sulfate recieved: No BMZ received: No  Physical exam  Vitals:   04/17/19 0516 04/17/19 1400 04/17/19 2159 04/18/19 0627  BP: 111/73 103/67 115/77 109/65  Pulse: 67 66 71 70  Resp: 18 18 14 18   Temp: 97.8 F (36.6 C) 98.4 F (36.9 C) (!) 97 F (36.1 C) 97.9 F (36.6 C)  TempSrc: Oral Oral Axillary Oral  SpO2:   100%   Weight:      Height:       General: alert, cooperative and no distress Lochia: appropriate Uterine Fundus: firm Incision: N/A DVT Evaluation: No evidence of DVT seen on physical exam. No cords or calf tenderness. No significant calf/ankle edema. Labs: Lab Results  Component Value Date   WBC 14.9 (H) 04/16/2019   HGB 10.5 (L) 04/16/2019   HCT 33.1 (L) 04/16/2019   MCV 86.6 04/16/2019   PLT 187 04/16/2019   CMP Latest Ref Rng & Units 04/17/2019  Glucose 70 - 99 mg/dL -  BUN 6 - 20 mg/dL -  Creatinine 0.44 - 1.00 mg/dL -  Sodium 135 - 145 mmol/L -  Potassium 3.5 - 5.1 mmol/L -  Chloride 98 - 111 mmol/L -  CO2 22 - 32 mmol/L -  Calcium 8.9 - 10.3 mg/dL -  Total Protein 6.5 - 8.1 g/dL -  Total Bilirubin 0.3 - 1.2 mg/dL 4.1(H)  Alkaline Phos 38 - 126 U/L -  AST 15 - 41 U/L -  ALT 0 -  44 U/L -    Discharge instruction: per After Visit Summary and "Baby and Me Booklet".  After visit meds:  Allergies as of 04/18/2019   No Known Allergies     Medication List    STOP taking these medications   amoxicillin 500 MG capsule Commonly known as: AMOXIL     TAKE these medications   acetaminophen 325 MG tablet Commonly known as: Tylenol Take 2 tablets (650 mg total) by mouth every 4 (four) hours as needed (for pain scale < 4).   ibuprofen 600 MG tablet Commonly known as: ADVIL Take 1 tablet (600 mg total) by mouth every 6 (six) hours.       Diet: routine diet  Activity: Advance as tolerated. Pelvic rest for 6 weeks.   Outpatient follow up:2 weeks  for interval BTL surgical consult, 4 weeks for routine PP visit Follow up Appt: No future appointments. Follow up Visit: Follow-up Information    [facility] Follow up.   Specialty: Obstetrics and Gynecology Why: They will call you with an appointent for your postpartum visit in about 4 weeks Contact information: [address], [address] [id number] mc [place] [address] [phone]           Please schedule this patient for Postpartum visit in: 4 weeks with the following provider: Any provider For C/S patients schedule nurse incision check in weeks 2 weeks:  High risk pregnancy complicated by: heroin abuse, one PNV Delivery mode:  SVD Anticipated Birth Control:  depo in hospital, plans BTL PP Procedures needed: BTL  Schedule Integrated BH visit: yes      Newborn Data: Live born female  Birth Weight:   APGAR: ,   Newborn Delivery   Birth date/time: 04/16/2019 05:04:00 Delivery type:       Baby Feeding: Bottle Disposition:rooming in, CPS and SW involved   04/18/2019 Vonzella Nipple, PA-C

## 2019-04-16 NOTE — MAU Note (Signed)
No symptoms, swab collected without problme.
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
## 2019-04-16 NOTE — H&P (Addendum)
OBSTETRIC ADMISSION HISTORY AND PHYSICAL  Subective Victoria Soto is a 33 y.o. female [phone] with IUP at 3w6dby LMP admitted for vaginal delivery at home. Mom reports she was having contractions earlier in the day and had SROM at home. [facility]. Dr. ARoselie Awkwardwas consulted and discharged patient home. She followed up with him via telephone encounter on 04/15/19 for first prenatal care. She arrived by EMS and was picked up at [facility] She is currently homeless. Hx of IVDU, last use 3 weeks ago.   Support person in labor: N/A  Ultrasounds . None   Prenatal History/Complications: . No (very late) PNC  . Heroine use . Tobacco use  . Homelessness . Hx of CPS cases   Past Medical History:  Diagnosis Date  . Anxiety   . Depression   . Headache(784.0)   . Heroin abuse affecting pregnancy ([place]   . Homeless   . IVDU (intravenous drug user)   . No pertinent past medical history    Past Surgical History:  Procedure Laterality Date  . NO PAST SURGERIES     OB History    Gravida  6   Para  3   Term  3   Preterm  0   AB  2   Living  3     SAB  2   TAB  0   Ectopic  0   Multiple  0   Live Births  3          Social History   Socioeconomic History  . Marital status: Married    Spouse name: Not on file  . Number of children: Not on file  . Years of education: Not on file  . Highest education level: Not on file  Occupational History  . Not on file  Social Needs  . Financial resource strain: Not on file  . Food insecurity    Worry: Not on file    Inability: Not on file  . Transportation needs    Medical: Not on file    Non-medical: Not on file  Tobacco Use  . Smoking status: Current Every Day Smoker    Packs/day: 1.00    Years: 10.00    Pack years: 10.00    Types: Cigarettes    Last attempt to quit: 02/18/2017    Years since quitting: 2.1  . Smokeless tobacco: Never Used  Substance and Sexual Activity   . Alcohol use: No    Alcohol/week: 0.0 standard drinks  . Drug use: Yes    Types: Heroin    Comment: patient reports last used 3-4 weeks ago  . Sexual activity: Yes    Birth control/protection: Surgical, Injection    Comment: depo  Lifestyle  . Physical activity    Days per week: Not on file    Minutes per session: Not on file  . Stress: Not on file  Relationships  . Social [place] phone: Not on file    Gets together: Not on file    Attends religious service: Not on file    Active member of club or organization: Not on file    Attends meetings of clubs or organizations: Not on file    Relationship status: Not on file  Other Topics Concern  . Not on file  Social History Narrative   04/16/19   Patient is currently homeless and [facility] (who is the FOB). Has three other children all under the age of 59. She does not have custody of any of them. Yes tobacco use. Yes drug use - reports heroin only. No ETOH.    Family History  Problem Relation Age of Onset  . Muscular dystrophy Other   . Hypertension Mother   . Diabetes Father   . Cirrhosis Paternal Grandfather   . Anesthesia problems Neg Hx    Allergies: No Known Allergies Medications:  none  Review of Systems  All systems reviewed and negative except as stated in HPI  Objective Physical Exam:  BP 126/89   Pulse 85   Temp 97.8 F (36.6 C) (Oral)   Ht _0  (1.626 m)   Wt 62.6 kg   LMP 07/11/2018   BMI 23.69 kg/m   Gen: NAD, alert, non-toxic, older than stated age, poor dentition HEENT: Normocephaic. Contusion to left undereye. PERRLA from 64m to 338m mild no scleral icterus and injection. Normal EOM.Nares patent with no discharge.  White lesion on left lip corner. No drainage. Oropharynx without erythema and lesions.  Tonsils nonswollen and without exudate.   Chest: large contusion to right medial chest.  CV: Regular rate and rhythm.  Normal S1-S2.  No murmur,  gallops, S3, S4 appreciated.  Normal capillary refill bilaterally.  Radial pulses 2+ bilaterally. No bilateral lower extremity edema. Resp: Clear to auscultation bilaterally.  No wheezing, rales, rhonchi, or other abnormal lung sounds.  No increased work of breathing appreciated. Abd: Firm uterus Psych: Cooperative with exam.  Normal speech. Pleasant. Makes good eye contact. GU: Minor 1st degree lacerations in vagina and periurethral. No obvious rash or sores. Patient is not in pain when I look for lacerations.   Prenatal labs: ABO, Rh: --/--/O POS, O POS [facility] [place] [phone]) Antibody: NEG (05/30 1332) Rubella: 3.41 (05/30 1332) RPR: Non Reactive (05/30 1332)  HBsAg: Negative (05/30 1332)  HIV: Non Reactive (05/30 1332)  GBS:   positive Glucola: na Genetic screening:  na  Prenatal Transfer Tool  Maternal Diabetes: unknown Genetic Screening: none Maternal Ultrasounds/Referrals: none Fetal Ultrasounds or other Referrals:  none Maternal Substance Abuse:  Yes:  Type: Smoker, Other: Heroine Significant Maternal Medications:  None Significant Maternal Lab Results: None  No results found for this or any previous visit (from the past 24 hour(s)).  Patient Active Problem List   Diagnosis Date Noted  . Heroin abuse ([place]06/10/2019  . Vaginal delivery 04/16/2019  . IVDU (intravenous drug user)   . Homeless   . Heroin abuse affecting pregnancy ([place]  . Depression   . Anxiety   . Late prenatal care affecting pregnancy, antepartum 04/15/2019  . Supervision of normal pregnancy 07/04/2017  . Depression with anxiety 07/04/2017  . Smoker 04/25/2015    Assessment & Plan:  Victoria HOLLINSWORTHs a [age].o. [id number] after SVD at home . VSS.   Postpartum Planning . Girl/ breast (f/u UDS) / BTL (depo inpatient until o/p BTL)   . SW consult - homelessness, drug abuse, previous CPS cases . Needs Tdap  . Mom  GBS positive w/ no ppx  . F/U UDS, CMP, CBC, GC/Chlamydia, COVID  RacZettie Cooley.D.  Family Medicine  PGY-1 04/16/2019 6:59 AM   I personally saw and evaluated the patient, performing the key elements of the service. I developed and verified the management plan that is described  in the resident's/student's note, and I agree with the content with my edits above. VSS, HRR&R, Resp unlabored, Legs neg.  Nigel Berthold, CNM 04/19/2019 12:22 PM

## 2019-04-16 NOTE — Lactation Note (Signed)
This note was copied from a baby's chart. Lactation Consultation Note  Patient Name: Victoria Soto [id number] Date: 04/16/2019   Spoke to [place] and she told LC that mom is currently doing just formula due to her history of substance abuse and CPS involvement. Mom tested positive for opiates, she's put baby to the breast once but all the subsequent feedings have been formula, baby is on [employer]. Asked RN staff to update on mom's status; BF services not needed at this point.   Maternal Data    Feeding Feeding Type: Formula(encouraged to feed at least every 4 hours) Nipple Type: Slow - flow  LATCH Score                   Interventions    Lactation Tools Discussed/Used     Consult Status      Cristo Ausburn S Khayri Kargbo 04/16/2019, 3:05 PM

## 2019-04-17 ENCOUNTER — Encounter (HOSPITAL_COMMUNITY): Payer: Self-pay | Admitting: *Deleted

## 2019-04-17 LAB — COMMENT2 - HEP PANEL

## 2019-04-17 LAB — BILIRUBIN, FRACTIONATED(TOT/DIR/INDIR)
Bilirubin, Direct: 2.3 mg/dL — ABNORMAL HIGH (ref 0.0–0.2)
Indirect Bilirubin: 1.8 mg/dL — ABNORMAL HIGH (ref 0.3–0.9)
Total Bilirubin: 4.1 mg/dL — ABNORMAL HIGH (ref 0.3–1.2)

## 2019-04-17 LAB — HEPATITIS C ANTIBODY (REFLEX): HCV Ab: >11 s/co ratio — ABNORMAL HIGH (ref 0.0–0.9)

## 2019-04-17 MED ORDER — PNEUMOCOCCAL VAC POLYVALENT 25 MCG/0.5ML IJ INJ
0.5000 mL | INJECTION | INTRAMUSCULAR | Status: AC
  Administered 2019-04-17: 0.5 mL via INTRAMUSCULAR
  Filled 2019-04-17: qty 0.5

## 2019-04-17 NOTE — Progress Notes (Signed)
Post Partum Day 1 Subjective: no complaints, up ad lib, voiding, tolerating PO and + flatus  Objective: Blood pressure 111/73, pulse 67, temperature 97.8 F (36.6 C), temperature source Oral, resp. rate 18, height 5\' 4"  (1.626 m), weight 62.6 kg, last menstrual period 07/11/2018, SpO2 99 %, unknown if currently breastfeeding.  Physical Exam:  General: alert, cooperative and no distress Lochia: appropriate Uterine Fundus: firm Incision: n/a DVT Evaluation: No evidence of DVT seen on physical exam.  Recent Labs    04/16/19 2249  HGB 10.5*  HCT 33.1*   MDM: Pt desires BTL but with no prenatal care, Medicaid 30 day consent papers were not signed.  Went to pt room to discuss BTL and sign consent but she does not have her Medicaid card with her today.  Discussed alternative options including condoms until BTL, and in hospital options including Depo Provera, Nexplanon, or Rx for OCPs.  Pt would like Depo in the hospital and reports she will come to the office to sign BTL papers when appointment is made.   Assessment/Plan: Plan for discharge tomorrow and Contraception Depo in hospital, interval BTL   LOS: 1 day   Fatima Blank 04/17/2019, 2:40 PM

## 2019-04-17 NOTE — Progress Notes (Addendum)
Post Partum Day 1  Subjective: Up ad lib, voiding, tolerating PO, and + flatus. Denies dizziness, lightheadedness, tachycardia, n/v, tremors, diaphoresis. Appropriate Lochia. Pain well controlled. Patient reports that she does want to undergo substance abuse rehab. She has never been in rehab before.   Objective: BP 98/64   Pulse 72   Temp 98.5 F (36.9 C) (Oral)   Resp 18   Ht 5\' 4"  (1.626 m)   Wt 62.6 kg   LMP 07/11/2018   SpO2 99%   Breastfeeding Unknown   BMI 23.69 kg/m   Intake/Output      06/12 0701 - 06/13 0700       Urine Occurrence 1 x     Physical Exam:  General: alert, cooperative, appears stated age, fatigued, and no distress Skin: mild jaundiced Lungs: CTAB, no crackles or wheezes Heart: RRR, no m/r/r.  Abdomen: no liver edge appreciated. +BS. NTND Lochia: appropriate Uterine Fundus: firm DVT Evaluation: No evidence of DVT seen on physical exam, extremities warm and well perfused.   CBC: Recent Labs  Lab 04/16/19 2249  WBC 14.9*  HGB 10.5*  HCT 33.1*  MCV 86.6  PLT 187   CMP: Recent Labs  Lab 04/16/19 2249  NA 139  K 3.6  CL 106  CO2 25  GLUCOSE 86  BUN 6  CREATININE 0.71  CALCIUM 8.6*  ALBUMIN 1.8*     Liver Function Tests: Recent Labs  Lab 04/16/19 2249  AST 92*  ALT 44  ALKPHOS 215*  BILITOT 5.3*  PROT 4.8*  ALBUMIN 1.8*    Assessment/Plan: Postpartum Day # 1 . Plan for d/c tomorrow  . No breast feeding, formula feeding only  . Contraception: discussed and patient desires for BTL, will get depo placed while in patient prior to discharge . Circumcision: decline d/t cost  #Social Issues Homelessness, hx of CPS cases, drug use during pregnancy. Positive for opiates on admission and in combination with history is concerning for NAS. Marland Kitchen Social Work: following, CPS case opened  . Referral to rehab/counesling/clinic  # Hepatitis C  HCV positive, reflexing to hep c panel (pending). Will also obtain HCV quant. LFT s/f AST/ALT  92(H)/44, Tbili 5.3. Platelets 187. Noted to be jaundiced on admission. Son will need to be tested at 18 months. Patient not previously aware of HCV, but reports that her husband had it last year. No liver edge appreciated on exam.  . F/u reflex hep C panel . F/u HCV Quant  . Will need PCP follow up for HCV f/u  . Will order fractionated bili   #Opioid Abuse  . COWS protocol . Avoid opiates in this patient  #History of Depression/Anxiety   PCP for close follow up   LOS: 1 day   Wilber Oliphant 04/17/2019, 4:55 AM

## 2019-04-17 NOTE — Plan of Care (Signed)
  Problem: Activity: Goal: Will verbalize the importance of balancing activity with adequate rest periods Outcome: Completed/Met

## 2019-04-17 NOTE — Progress Notes (Signed)
Rn took out of IV. IV was Clean , dry and intact.

## 2019-04-17 NOTE — Progress Notes (Signed)
CSW acknowledged consult for Edinburgh score of 14.  CSW is screening out consult due to clinical assessment completed on 04/16/19 by CSW M. Burcham addressing PMADs.  Resources and education was provided.   There are no barriers to discharge.   Laurey Arrow, MSW, LCSW Clinical Social Work [phone]

## 2019-04-17 NOTE — Plan of Care (Signed)
  Problem: Activity: Goal: Will verbalize the importance of balancing activity with adequate rest periods Outcome: Completed/Met   Problem: Activity: Goal: Ability to tolerate increased activity will improve Outcome: Completed/Met   

## 2019-04-17 NOTE — Plan of Care (Signed)
Teach back 

## 2019-04-18 LAB — HCV RNA QUANT
HCV Quantitative Log: 5.622 log10 IU/mL (ref >1.70)
HCV Quantitative: 419000 IU/mL (ref >50)

## 2019-04-18 MED ORDER — IBUPROFEN 600 MG PO TABS: 600.0000 mg | ORAL_TABLET | Freq: Four times a day (QID) | ORAL | 0 refills | Status: AC

## 2019-04-18 MED ORDER — ACETAMINOPHEN 325 MG PO TABS: 650.0000 mg | ORAL_TABLET | ORAL | 0 refills | Status: AC | PRN

## 2019-04-18 NOTE — Progress Notes (Signed)
CSW spoke with CPS worker Casey Owens.  Per CPS, there are barriers to infant discharging to [facility].  [facility] is aware of barriers and has agreed to not remove infant from hospital when medically ready. CPS is in there initial phase of investigating and has agreed to contact CSW when a safety disposition plan is established.   Patricia Perales Boyd-Gilyard, MSW, LCSW Clinical Social Work [phone] 

## 2019-04-18 NOTE — Discharge Instructions (Signed)
Vaginal Delivery, Care After °Refer to this sheet in the next few weeks. These instructions provide you with information about caring for yourself after vaginal delivery. Your health care provider may also give you more specific instructions. Your treatment has been planned according to current medical practices, but problems sometimes occur. Call your health care provider if you have any problems or questions. °What can I expect after the procedure? °After vaginal delivery, it is common to have: °· Some bleeding from your vagina. °· Soreness in your abdomen, your vagina, and the area of skin between your vaginal opening and your anus (perineum). °· Pelvic cramps. °· Fatigue. °Follow these instructions at home: °Medicines °· Take over-the-counter and prescription medicines only as told by your health care provider. °· If you were prescribed an antibiotic medicine, take it as told by your health care provider. Do not stop taking the antibiotic until it is finished. °Driving ° °· Do not drive or operate heavy machinery while taking prescription pain medicine. °· Do not drive for 24 hours if you received a sedative. °Lifestyle °· Do not drink alcohol. This is especially important if you are breastfeeding or taking medicine to relieve pain. °· Do not use tobacco products, including cigarettes, chewing tobacco, or e-cigarettes. If you need help quitting, ask your health care provider. °Eating and drinking °· Drink at least 8 eight-ounce glasses of water every day unless you are told not to by your health care provider. If you choose to breastfeed your baby, you may need to drink more water than this. °· Eat high-fiber foods every day. These foods may help prevent or relieve constipation. High-fiber foods include: °? Whole grain cereals and breads. °? Brown rice. °? Beans. °? Fresh fruits and vegetables. °Activity °· Return to your normal activities as told by your health care provider. Ask your health care provider what  activities are safe for you. °· Rest as much as possible. Try to rest or take a nap when your baby is sleeping. °· Do not lift anything that is heavier than your baby or 10 lb (4.5 kg) until your health care provider says that it is safe. °· Talk with your health care provider about when you can engage in sexual activity. This may depend on your: °? Risk of infection. °? Rate of healing. °? Comfort and desire to engage in sexual activity. °Vaginal Care °· If you have an episiotomy or a vaginal tear, check the area every day for signs of infection. Check for: °? More redness, swelling, or pain. °? More fluid or blood. °? Warmth. °? Pus or a bad smell. °· Do not use tampons or douches until your health care provider says this is safe. °· Watch for any blood clots that may pass from your vagina. These may look like clumps of dark red, brown, or black discharge. °General instructions °· Keep your perineum clean and dry as told by your health care provider. °· Wear loose, comfortable clothing. °· Wipe from front to back when you use the toilet. °· Ask your health care provider if you can shower or take a bath. If you had an episiotomy or a perineal tear during labor and delivery, your health care provider may tell you not to take baths for a certain length of time. °· Wear a bra that supports your breasts and fits you well. °· If possible, have someone help you with household activities and help care for your baby for at least a few days after you   leave the hospital.  Keep all follow-up visits for you and your baby as told by your health care provider. This is important. Contact a health care provider if:  You have: ? Vaginal discharge that has a bad smell. ? Difficulty urinating. ? Pain when urinating. ? A sudden increase or decrease in the frequency of your bowel movements. ? More redness, swelling, or pain around your episiotomy or vaginal tear. ? More fluid or blood coming from your episiotomy or vaginal  tear. ? Pus or a bad smell coming from your episiotomy or vaginal tear. ? A fever. ? A rash. ? Little or no interest in activities you used to enjoy. ? Questions about caring for yourself or your baby.  Your episiotomy or vaginal tear feels warm to the touch.  Your episiotomy or vaginal tear is separating or does not appear to be healing.  Your breasts are painful, hard, or turn red.  You feel unusually sad or worried.  You feel nauseous or you vomit.  You pass large blood clots from your vagina. If you pass a blood clot from your vagina, save it to show to your health care provider. Do not flush blood clots down the toilet without having your health care provider look at them.  You urinate more than usual.  You are dizzy or light-headed.  You have not breastfed at all and you have not had a menstrual period for 12 weeks after delivery.  You have stopped breastfeeding and you have not had a menstrual period for 12 weeks after you stopped breastfeeding. Get help right away if:  You have: ? Pain that does not go away or does not get better with medicine. ? Chest pain. ? Difficulty breathing. ? Blurred vision or spots in your vision. ? Thoughts about hurting yourself or your baby.  You develop pain in your abdomen or in one of your legs.  You develop a severe headache.  You faint.  You bleed from your vagina so much that you fill two sanitary pads in one hour. This information is not intended to replace advice given to you by your health care provider. Make sure you discuss any questions you have with your health care provider. Document Released: 10/18/2000 Document Revised: 04/03/2016 Document Reviewed: 11/05/2015 Elsevier Interactive Patient Education  2019 Elsevier Inc.  Laparoscopic Tubal Ligation Laparoscopic tubal ligation is a procedure to close the fallopian tubes. This is done so that you cannot get pregnant. When the fallopian tubes are closed, the eggs that  your ovaries release cannot enter the uterus, and sperm cannot reach the released eggs. A laparoscopic tubal ligation is sometimes called "getting your tubes tied." You should not have this procedure if you want to get pregnant someday or if you are unsure about having more children. Tell a health care provider about:  Any allergies you have.  All medicines you are taking, including vitamins, herbs, eye drops, creams, and over-the-counter medicines.  Any problems you or family members have had with anesthetic medicines.  Any blood disorders you have.  Any surgeries you have had.  Any medical conditions you have.  Whether you are pregnant or may be pregnant.  Any past pregnancies. What are the risks? Generally, this is a safe procedure. However, problems may occur, including:  Infection.  Bleeding.  Injury to surrounding organs.  Side effects from anesthetics.  Failure of the procedure. This procedure can increase your risk of a kind of pregnancy in which a fertilized egg attaches to  the outside of the uterus (ectopic pregnancy). What happens before the procedure?  Ask your health care provider about: ? Changing or stopping your regular medicines. This is especially important if you are taking diabetes medicines or blood thinners. ? Taking medicines such as aspirin and ibuprofen. These medicines can thin your blood. Do not take these medicines before your procedure if your health care provider instructs you not to.  Follow instructions from your health care provider about eating and drinking restrictions.  Plan to have someone take you home after the procedure.  If you go home right after the procedure, plan to have someone with you for 24 hours. What happens during the procedure?      You will be given one or more of the following: ? A medicine to help you relax (sedative). ? A medicine to numb the area (local anesthetic). ? A medicine to make you fall asleep  (general anesthetic). ? A medicine that is injected into an area of your body to numb everything below the injection site (regional anesthetic).  An IV tube will be inserted into one of your veins. It will be used to give you medicines and fluids during the procedure.  Your bladder may be emptied with a small tube (catheter).  If you have been given a general anesthetic, a tube will be put down your throat to help you breathe.  Two small cuts (incisions) will be made in your lower abdomen and near your belly button.  Your abdomen will be inflated with a gas. This will let the surgeon see better and will give the surgeon room to work.  A thin, lighted tube (laparoscope) with a camera attached will be inserted into your abdomen through one of the incisions. Small instruments will be inserted through the other incision.  The fallopian tubes will be tied off, burned (cauterized), or blocked with a clip, ring, or clamp. A small portion in the center of each fallopian tube may be removed.  The gas will be released from the abdomen.  The incisions will be closed with stitches (sutures).  A bandage (dressing) will be placed over the incisions. The procedure may vary among health care providers and hospitals. What happens after the procedure?  Your blood pressure, heart rate, breathing rate, and blood oxygen level will be monitored often until the medicines you were given have worn off.  You will be given medicine to help with pain, nausea, and vomiting as needed. This information is not intended to replace advice given to you by your health care provider. Make sure you discuss any questions you have with your health care provider. Document Released: 01/27/2001 Document Revised: 06/17/2017 Document Reviewed: 10/01/2015 Elsevier Interactive Patient Education  2019 [employer].

## 2019-04-24 ENCOUNTER — Inpatient Hospital Stay (HOSPITAL_COMMUNITY): Payer: Medicaid Other

## 2019-05-12 ENCOUNTER — Telehealth: Payer: Self-pay | Admitting: Obstetrics & Gynecology

## 2019-05-12 NOTE — Telephone Encounter (Signed)
Called the patient to inform of upcoming virtual visit. Informed the patient of the date and time and please be sure the [employer] Meeting app is downloaded. If you have any questions or concerns please contact our office at [phone]. °

## 2019-05-13 ENCOUNTER — Ambulatory Visit: Payer: Medicaid Other | Admitting: Clinical

## 2019-05-13 ENCOUNTER — Other Ambulatory Visit: Payer: Self-pay

## 2019-05-13 ENCOUNTER — Telehealth: Payer: Self-pay | Admitting: Obstetrics and Gynecology

## 2019-05-13 NOTE — BH Specialist Note (Signed)
Pt did not arrive to [facility] video visit and did not answer the phone; Left HIPPA-compliant message to call back Roselyn Reef from [facility] at [phone], and left MyChart message for patient.   ntegrated Behavioral Health via Telemedicine Video Visit  05/13/2019 Victoria Soto [phone]   Shanon Payor

## 2019-05-13 NOTE — Telephone Encounter (Signed)
Attempted to call patient about her appointment on 7/10 @ 9:55. No answer, left voicemail instructing patient to wear a face mask for the entire appointment and no visitors are allowed during the visit. Patient instructed not to attend the appointment if she has any symptoms. Symptoms list left.

## 2019-05-14 ENCOUNTER — Encounter: Payer: Self-pay | Admitting: Obstetrics and Gynecology

## 2019-05-14 ENCOUNTER — Ambulatory Visit: Payer: Medicaid Other | Admitting: Obstetrics and Gynecology

## 2019-05-14 ENCOUNTER — Telehealth: Payer: Medicaid Other | Admitting: Women's Health

## 2019-05-15 NOTE — Progress Notes (Signed)
Patient did not keep her postpartum appointment for 05/14/2019.  Durene Romans MD Attending [facility] [employer] farm manager)

## 2019-05-19 ENCOUNTER — Other Ambulatory Visit: Payer: Self-pay

## 2019-05-19 ENCOUNTER — Emergency Department (HOSPITAL_COMMUNITY)
Admission: EM | Admit: 2019-05-19 | Discharge: 2019-05-19 | Disposition: A | Discharge status: Home / Self Care | Payer: Medicaid Other | Attending: Emergency Medicine | Admitting: Emergency Medicine

## 2019-05-19 DIAGNOSIS — F17210 Nicotine dependence, cigarettes, uncomplicated: Secondary | ICD-10-CM | POA: Insufficient documentation

## 2019-05-19 DIAGNOSIS — H6692 Otitis media, unspecified, left ear: Secondary | ICD-10-CM | POA: Insufficient documentation

## 2019-05-19 DIAGNOSIS — T401X1A Poisoning by heroin, accidental (unintentional), initial encounter: Secondary | ICD-10-CM | POA: Diagnosis not present

## 2019-05-19 DIAGNOSIS — T50904A Poisoning by unspecified drugs, medicaments and biological substances, undetermined, initial encounter: Secondary | ICD-10-CM | POA: Diagnosis present

## 2019-05-19 DIAGNOSIS — Z590 Homelessness: Secondary | ICD-10-CM | POA: Insufficient documentation

## 2019-05-19 DIAGNOSIS — H6690 Otitis media, unspecified, unspecified ear: Secondary | ICD-10-CM

## 2019-05-19 MED ORDER — AMOXICILLIN 500 MG PO CAPS: 500.0000 mg | ORAL_CAPSULE | Freq: Two times a day (BID) | ORAL | 0 refills | Status: AC

## 2019-05-19 NOTE — ED Notes (Signed)
Pt alert, reports that dizziness has resolved.  Pt tolerating PO intake well, provided with ham sandwich.  Will continue to monitor.

## 2019-05-19 NOTE — ED Notes (Signed)
Pt provided pitcher of water, encourage to drink most of water to help resolve dizziness.  Will continue to monitor.

## 2019-05-19 NOTE — ED Notes (Signed)
Pt c/o some dizziness, headache.  Denies SI/HI at this time.  Pt aox4, able to speak full sentences.

## 2019-05-19 NOTE — ED Triage Notes (Addendum)
Pt BIBA from [facility], bystander called EMS after finding pt on ground.   EMS reports pt apneic on arrival, received 1 mg IN Narcan (appx 8299), pt ventilated with ambu bag, EMS gave second dose Narcan ( 1307), pt became alert.  Pt alert, able to speak full sentences on arrival to ED.    Pt admitted to heroine use earlier today.

## 2019-05-19 NOTE — Discharge Instructions (Signed)
Your history and exam today are consistent with an accidental heroin overdose.  The Narcan was able to get your breathing again and we watched you for a period of time to make sure you did not have recurrence of your altered mental status.  We also confirmed the ear infection that was suspected several weeks ago.  Please take the prescription for antibiotics to treat the ear infection and follow-up with your primary team.  Included all resources for substance abuse.  Please return to the emergency department if you have any new or worsened symptoms.

## 2019-05-19 NOTE — ED Provider Notes (Signed)
[facility] Provider Note   CSN: [id number] Arrival date & time: 05/19/19  1533     History   Chief Complaint Chief Complaint  Patient presents with  . Drug Overdose    HPI Victoria Soto is a 33 y.o. female.     The history is provided by the patient and medical records. No language interpreter was used.  Drug Overdose This is a new problem. The current episode started less than 1 hour ago. The problem occurs constantly. The problem has been resolved. Pertinent negatives include no chest pain, no abdominal pain, no headaches and no shortness of breath. Nothing aggravates the symptoms. Relieved by: narcan with EMS. She has tried nothing for the symptoms. The treatment provided no relief.    Past Medical History:  Diagnosis Date  . Anxiety   . Depression   . Headache(784.0)   . Heroin abuse affecting pregnancy (HCC)   . Homeless   . IVDU (intravenous drug user)     Patient Active Problem List   Diagnosis Date Noted  . Heroin abuse (HCC) 04/16/2019  . Vaginal delivery 04/16/2019  . IVDU (intravenous drug user)   . Homeless   . Heroin abuse affecting pregnancy (HCC)   . Depression   . Anxiety   . Late prenatal care affecting pregnancy, antepartum 04/15/2019  . Supervision of normal pregnancy 07/04/2017  . Depression with anxiety 07/04/2017  . Smoker 04/25/2015    Past Surgical History:  Procedure Laterality Date  . NO PAST SURGERIES       OB History    Gravida  6   Para  4   Term  4   Preterm  0   AB  2   Living  4     SAB  2   TAB  0   Ectopic  0   Multiple  0   Live Births  4            Home Medications    Prior to Admission medications   Medication Sig Start Date End Date Taking? Authorizing Provider  acetaminophen (TYLENOL) 325 MG tablet Take 2 tablets (650 mg total) by mouth every 4 (four) hours as needed (for pain scale < 4). 04/18/19   Marny LowensteinWenzel, Julie N, PA-C  ibuprofen (ADVIL) 600 MG tablet  Take 1 tablet (600 mg total) by mouth every 6 (six) hours. 04/18/19   Marny LowensteinWenzel, Julie N, PA-C    Family History Family History  Problem Relation Age of Onset  . Muscular dystrophy Other   . Hypertension Mother   . Diabetes Father   . Cirrhosis Paternal Grandfather   . Anesthesia problems Neg Hx     Social History Social History   Tobacco Use  . Smoking status: Current Every Day Smoker    Packs/day: 1.00    Years: 10.00    Pack years: 10.00    Types: Cigarettes    Last attempt to quit: 02/18/2017    Years since quitting: 2.2  . Smokeless tobacco: Never Used  Substance Use Topics  . Alcohol use: No    Alcohol/week: 0.0 standard drinks  . Drug use: Yes    Types: Heroin    Comment: patient reports last used 3-4 weeks ago     Allergies   Patient has no known allergies.   Review of Systems Review of Systems  Constitutional: Positive for fatigue. Negative for chills and diaphoresis.  HENT: Negative for congestion.   Eyes: Negative for photophobia and  visual disturbance.  Respiratory: Negative for cough, chest tightness, shortness of breath and wheezing.   Cardiovascular: Negative for chest pain, palpitations and leg swelling.  Gastrointestinal: Negative for abdominal pain, constipation, diarrhea, nausea and vomiting.  Genitourinary: Negative for dysuria and flank pain.  Musculoskeletal: Negative for back pain, neck pain and neck stiffness.  Skin: Negative for rash and wound.  Neurological: Negative for dizziness, light-headedness, numbness and headaches.  Psychiatric/Behavioral: Negative for agitation.  All other systems reviewed and are negative.    Physical Exam Updated Vital Signs BP 113/82   Pulse 83   Temp 98.1 F (36.7 C) (Oral)   Resp 15   SpO2 100%   Physical Exam Vitals signs and nursing note reviewed.  Constitutional:      General: She is not in acute distress.    Appearance: She is well-developed. She is not ill-appearing, toxic-appearing or  diaphoretic.  HENT:     Head: Normocephalic and atraumatic.     Nose: No congestion.     Mouth/Throat:     Mouth: Mucous membranes are moist.     Pharynx: No oropharyngeal exudate or posterior oropharyngeal erythema.  Eyes:     Conjunctiva/sclera: Conjunctivae normal.     Pupils: Pupils are equal, round, and reactive to light.  Neck:     Musculoskeletal: Neck supple. No muscular tenderness.  Cardiovascular:     Rate and Rhythm: Normal rate and regular rhythm.     Heart sounds: No murmur.  Pulmonary:     Effort: Pulmonary effort is normal. No respiratory distress.     Breath sounds: Normal breath sounds. No wheezing, rhonchi or rales.  Chest:     Chest wall: No tenderness.  Abdominal:     Palpations: Abdomen is soft.     Tenderness: There is no abdominal tenderness.  Musculoskeletal:        General: No tenderness or signs of injury.     Right lower leg: No edema.     Left lower leg: No edema.  Skin:    General: Skin is warm and dry.     Capillary Refill: Capillary refill takes less than 2 seconds.     Findings: No erythema or rash.  Neurological:     General: No focal deficit present.     Mental Status: She is alert and oriented to person, place, and time.     Sensory: No sensory deficit.     Motor: No weakness.     Gait: Gait normal.  Psychiatric:        Mood and Affect: Mood normal.        Thought Content: Thought content does not include homicidal or suicidal ideation. Thought content does not include homicidal or suicidal plan.      ED Treatments / Results  Labs (all labs ordered are listed, but only abnormal results are displayed) Labs Reviewed - No data to display  EKG None  Radiology No results found.  Procedures Procedures (including critical care time)  Medications Ordered in ED Medications - No data to display   Initial Impression / Assessment and Plan / ED Course  I have reviewed the triage vital signs and the nursing notes.  Pertinent labs &  imaging results that were available during my care of the patient were reviewed by me and considered in my medical decision making (see chart for details).        Victoria Soto is a 33 y.o. female with a past medical history significant for IV drug  abuse with heroin, depression, and prior homelessness who presents with overdose.  Patient was found down and apneic and cyanotic by a bystander.  EMS was called and patient was given Narcan x2 with resolution of overdose.  Patient reports she had not used heroin "some time" and "overdid it"..  Patient reports that she is starting to feel better now but still feels slightly lightheaded.  She denies any fall and reports that she remembers laying herself to the ground before passing out.  She denies any chest pain, palpitations or shortness of breath.  She denies any neurologic complaints.  She does report that she has had an ear infection in her left ear for which she was prescribed antibiotics several weeks ago but never filled and never took them.  She is still having some ear pain.  She denies other complaints on arrival.  On exam, patient does have some erythema on her left TM.  Clinically it looks like an otitis media.  She will likely be discharged with antibiotics when she is were for discharge.  Patient's neuro exam is otherwise unremarkable.  Normal sensation and strength in all extremities.  Normal pulses in extremities.  Lungs clear chest nontender.  Abdomen nontender.  No evidence of acute trauma on initial exam.  Shared decision making conversation held with patient.  Given her lightheadedness, we agreed to give the patient some fluids and watch her for several hours to make sure that the Narcan does not wear off and the heroin caused her to be apneic again.  Given her lack of trauma and otherwise lack of preceding symptoms or concerns currently, will hold on CT imaging or labs at this time .  If patient is feeling better and she does not have  recurrent altered mental status or apnea, anticipate discharge home with prescription for antibiotic for her ear infection.   Patient was able to eat and drink and ambulate.  She was feeling much better.  No concern for repeat overdose.  Patient discharged with prescription for amoxicillin for her ear infection and PCP instructions to follow-up.  Patient had no other questions or concerns and was discharged in good condition.  On my evaluation the patient just prior to discharge she was not hypoxic or in any distress.   Final Clinical Impressions(s) / ED Diagnoses   Final diagnoses:  Accidental overdose of heroin, initial encounter (HCC)  Acute otitis media, unspecified otitis media type    ED Discharge Orders         Ordered    amoxicillin (AMOXIL) 500 MG capsule  2 times daily     05/19/19 2252          Clinical Impression: 1. Accidental overdose of heroin, initial encounter (HCC)   2. Acute otitis media, unspecified otitis media type     Disposition: Discharge  Condition: Good  I have discussed the results, Dx and Tx plan with the pt(& family if present). He/she/they expressed understanding and agree(s) with the plan. Discharge instructions discussed at great length. Strict return precautions discussed and pt &/or family have verbalized understanding of the instructions. No further questions at time of discharge.    Discharge Medication List as of 05/19/2019 10:53 PM    START taking these medications   Details  amoxicillin (AMOXIL) 500 MG capsule Take 1 capsule (500 mg total) by mouth 2 (two) times daily for 10 days., Starting Wed 05/19/2019, Until Sat 05/29/2019, Print        Follow Up:  [facility] [address] [place] [address] [phone] Schedule an appointment as soon as possible for a visit    [facility] [place] [id number] [place]  [place]       Matan Steen, Gwenyth Allegra, MD 05/20/19 [phone]

## 2019-06-25 ENCOUNTER — Encounter (HOSPITAL_COMMUNITY): Payer: Self-pay | Admitting: Emergency Medicine

## 2019-06-25 ENCOUNTER — Emergency Department (HOSPITAL_COMMUNITY)
Admission: EM | Admit: 2019-06-25 | Discharge: 2019-06-25 | Disposition: A | Discharge status: Home / Self Care | Payer: Medicaid Other | Attending: Emergency Medicine | Admitting: Emergency Medicine

## 2019-06-25 ENCOUNTER — Emergency Department (HOSPITAL_COMMUNITY): Payer: Medicaid Other

## 2019-06-25 ENCOUNTER — Other Ambulatory Visit: Payer: Self-pay

## 2019-06-25 DIAGNOSIS — Y929 Unspecified place or not applicable: Secondary | ICD-10-CM | POA: Insufficient documentation

## 2019-06-25 DIAGNOSIS — S0101XA Laceration without foreign body of scalp, initial encounter: Secondary | ICD-10-CM | POA: Diagnosis present

## 2019-06-25 DIAGNOSIS — Z590 Homelessness: Secondary | ICD-10-CM | POA: Insufficient documentation

## 2019-06-25 DIAGNOSIS — F17210 Nicotine dependence, cigarettes, uncomplicated: Secondary | ICD-10-CM | POA: Insufficient documentation

## 2019-06-25 DIAGNOSIS — S0003XA Contusion of scalp, initial encounter: Secondary | ICD-10-CM | POA: Diagnosis not present

## 2019-06-25 DIAGNOSIS — Y999 Unspecified external cause status: Secondary | ICD-10-CM | POA: Insufficient documentation

## 2019-06-25 DIAGNOSIS — W1830XA Fall on same level, unspecified, initial encounter: Secondary | ICD-10-CM | POA: Insufficient documentation

## 2019-06-25 DIAGNOSIS — W010XXA Fall on same level from slipping, tripping and stumbling without subsequent striking against object, initial encounter: Secondary | ICD-10-CM

## 2019-06-25 DIAGNOSIS — Y9301 Activity, walking, marching and hiking: Secondary | ICD-10-CM | POA: Diagnosis not present

## 2019-06-25 MED ORDER — CEFAZOLIN SODIUM-DEXTROSE 1-4 GM/50ML-% IV SOLN
1.0000 g | Freq: Once | INTRAVENOUS | Status: AC
  Administered 2019-06-25: 16:00:00 1 g via INTRAVENOUS
  Filled 2019-06-25: qty 50

## 2019-06-25 MED ORDER — LIDOCAINE-EPINEPHRINE (PF) 2 %-1:200000 IJ SOLN
20.0000 mL | Freq: Once | INTRAMUSCULAR | Status: AC
  Administered 2019-06-25: 20 mL
  Filled 2019-06-25: qty 20

## 2019-06-25 MED ORDER — CEPHALEXIN 500 MG PO CAPS: 500.0000 mg | ORAL_CAPSULE | Freq: Four times a day (QID) | ORAL | 0 refills | Status: AC

## 2019-06-25 MED ORDER — BACITRACIN ZINC 500 UNIT/GM EX OINT
TOPICAL_OINTMENT | Freq: Once | CUTANEOUS | Status: AC
  Administered 2019-06-25: 17:00:00 via TOPICAL
  Filled 2019-06-25: qty 0.9

## 2019-06-25 NOTE — ED Triage Notes (Signed)
Pt BIB EMS, found on ground outside behind a building, not wearing pants. Per EMS and pt, pt and husband are homeless and were outside. Pt stepped in a puddle of grease, fell, hit head, LOC. VSS. Pt used heroin yesterday.

## 2019-06-25 NOTE — ED Notes (Signed)
C-collar removed by Dr. Ashok Cordia

## 2019-06-25 NOTE — ED Notes (Signed)
Pt verbalized understanding of discharge instructions, prescriptions and care of sutures. Pt given scrub pants and left to home.

## 2019-06-25 NOTE — ED Provider Notes (Addendum)
[facility]   CSN: [id number] Arrival date & time: 06/25/19  1132     History   Chief Complaint Chief Complaint  Patient presents with  . Loss of Consciousness    HPI Victoria Soto is a [age] y.o. female.     Patient presents via EMS after slip and fall with contusion/laceration to scalp. Patient homeless, hx ivda, was walking behind a building when 'slipped on oil', and hit back of head. Brief loc. Pain c/o pain to scalp in area of laceration, pain acute onset, moderate, persistent, worse w palpation area. Denies neck or back pain. No radicular pain. No numbness/wekaness. Denies faintness or dizziness prior to fall. No anticoag use. Denies palpitations. No chest pain or discomfort. No sob. Denies other pain or injury. Tetanus imm earlier this year.   The history is provided by the patient and the EMS personnel.  Loss of Consciousness Associated symptoms: no chest pain, no confusion, no fever, no shortness of breath, no vomiting and no weakness     Past Medical History:  Diagnosis Date  . Anxiety   . Depression   . Headache(784.0)   . Heroin abuse affecting pregnancy (HCC)   . Homeless   . IVDU (intravenous drug user)     Patient Active Problem List   Diagnosis Date Noted  . Heroin abuse (HCC) 04/16/2019  . Vaginal delivery 04/16/2019  . IVDU (intravenous drug user)   . Homeless   . Heroin abuse affecting pregnancy (HCC)   . Depression   . Anxiety   . Late prenatal care affecting pregnancy, antepartum 04/15/2019  . Supervision of normal pregnancy 07/04/2017  . Depression with anxiety 07/04/2017  . Smoker 04/25/2015    Past Surgical History:  Procedure Laterality Date  . NO PAST SURGERIES       OB History    Gravida  6   Para  4   Term  4   Preterm  0   AB  2   Living  4     SAB  2   TAB  0   Ectopic  0   Multiple  0   Live Births  4            Home Medications    Prior to  Admission medications   Medication Sig Start Date End Date Taking? Authorizing Provider  acetaminophen (TYLENOL) 325 MG tablet Take 2 tablets (650 mg total) by mouth every 4 (four) hours as needed (for pain scale < 4). Patient not taking: Reported on 05/19/2019 04/18/19   Marny LowensteinWenzel, Julie N, PA-C  ibuprofen (ADVIL) 600 MG tablet Take 1 tablet (600 mg total) by mouth every 6 (six) hours. Patient not taking: Reported on 05/19/2019 04/18/19   Marny LowensteinWenzel, Julie N, PA-C  medroxyPROGESTERone (DEPO-PROVERA) 150 MG/ML injection Inject 150 mg into the muscle every 3 (three) months.    [provider]    Family History Family History  Problem Relation Age of Onset  . Muscular dystrophy Other   . Hypertension Mother   . Diabetes Father   . Cirrhosis Paternal Grandfather   . Anesthesia problems Neg Hx     Social History Social History   Tobacco Use  . Smoking status: Current Every Day Smoker    Packs/day: 1.00    Years: 10.00    Pack years: 10.00    Types: Cigarettes    Last attempt to quit: 02/18/2017    Years since quitting: 2.3  .  Smokeless tobacco: Never Used  Substance Use Topics  . Alcohol use: No    Alcohol/week: 0.0 standard drinks  . Drug use: Yes    Types: Heroin, IV    Comment: used heroin yesterday     Allergies   Patient has no known allergies.   Review of Systems Review of Systems  Constitutional: Negative for fever.  HENT: Negative for nosebleeds.   Eyes: Negative for pain and visual disturbance.  Respiratory: Negative for shortness of breath.   Cardiovascular: Positive for syncope. Negative for chest pain.  Gastrointestinal: Negative for abdominal pain and vomiting.  Genitourinary: Negative for flank pain.  Musculoskeletal: Negative for back pain and neck pain.  Skin: Positive for wound. Negative for rash.  Neurological: Negative for weakness and numbness.  Hematological: Does not bruise/bleed easily.  Psychiatric/Behavioral: Negative for confusion.      Physical Exam Updated Vital Signs BP 106/65   Pulse 78   Temp 98.6 F (37 C) (Oral)   Resp 16   Ht 1.626 m (5\' 4" )   Wt 54.5 kg   LMP  (LMP Unknown) Comment: per pt she is spotting right now  SpO2 99%   Breastfeeding No   BMI 20.62 kg/m   Physical Exam Vitals signs and nursing note reviewed.  Constitutional:      Appearance: Normal appearance. She is well-developed.  HENT:     Head:     Comments: Contusion and laceration to left temporal scalp. Irregular, contused, 3.5-4 cm laceration to scalp, central part of laceration w v-shaped flap.     Nose: Nose normal.     Mouth/Throat:     Mouth: Mucous membranes are moist.  Eyes:     General: No scleral icterus.    Extraocular Movements: Extraocular movements intact.     Conjunctiva/sclera: Conjunctivae normal.     Pupils: Pupils are equal, round, and reactive to light.  Neck:     Musculoskeletal: Normal range of motion and neck supple. No neck rigidity or muscular tenderness.     Trachea: No tracheal deviation.  Cardiovascular:     Rate and Rhythm: Normal rate and regular rhythm.     Pulses: Normal pulses.     Heart sounds: Normal heart sounds. No murmur. No friction rub. No gallop.   Pulmonary:     Effort: Pulmonary effort is normal. No respiratory distress.     Breath sounds: Normal breath sounds.  Abdominal:     General: Bowel sounds are normal. There is no distension.     Palpations: Abdomen is soft.     Tenderness: There is no abdominal tenderness. There is no guarding.  Genitourinary:    Comments: No cva tenderness.  Musculoskeletal:        General: No swelling.     Comments: Mild mid cervical tenderness, otherwise, CTLS spine, non tender, aligned, no step off. Good rom bil extremities without pain or focal bony tenderness.   Skin:    General: Skin is warm and dry.     Findings: No rash.  Neurological:     Mental Status: She is alert.     Comments: Alert, speech normal. Motor intact bil, stre 5/5. sens grossly  intact bil. GCS 15.   Psychiatric:        Mood and Affect: Mood normal.      ED Treatments / Results  Labs (all labs ordered are listed, but only abnormal results are displayed) Labs Reviewed - No data to display  EKG None  Radiology Ct Head  Wo Contrast  Result Date: 06/25/2019 CLINICAL DATA:  Recent slip and fall with head injury, initial encounter EXAM: CT HEAD WITHOUT CONTRAST CT CERVICAL SPINE WITHOUT CONTRAST TECHNIQUE: Multidetector CT imaging of the head and cervical spine was performed following the standard protocol without intravenous contrast. Multiplanar CT image reconstructions of the cervical spine were also generated. COMPARISON:  None. FINDINGS: CT HEAD FINDINGS Brain: No evidence of acute infarction, hemorrhage, hydrocephalus, extra-axial collection or mass lesion/mass effect. Vascular: No hyperdense vessel or unexpected calcification. Skull: Normal. Negative for fracture or focal lesion. Sinuses/Orbits: No acute finding. Other: Soft tissue hematoma with small laceration noted in the left parietal region consistent with the injury. CT CERVICAL SPINE FINDINGS Exam is somewhat degraded by patient motion artifact. Alignment: Within normal limits. Skull base and vertebrae: 7 cervical segments are well visualized. Vertebral body height is well maintained. No acute fracture or acute facet abnormality is seen. Soft tissues and spinal canal: Surrounding soft tissue structures are unremarkable. Upper chest: Negative. Other: None IMPRESSION: CT of the head: Soft tissue hematoma in the left parietal region. No acute intracranial abnormality is noted. CT of cervical spine: No acute abnormality noted. Electronically Signed   By: Alcide Clever M.D.   On: 06/25/2019 12:47   Ct Cervical Spine Wo Contrast  Result Date: 06/25/2019 CLINICAL DATA:  Recent slip and fall with head injury, initial encounter EXAM: CT HEAD WITHOUT CONTRAST CT CERVICAL SPINE WITHOUT CONTRAST TECHNIQUE: Multidetector CT  imaging of the head and cervical spine was performed following the standard protocol without intravenous contrast. Multiplanar CT image reconstructions of the cervical spine were also generated. COMPARISON:  None. FINDINGS: CT HEAD FINDINGS Brain: No evidence of acute infarction, hemorrhage, hydrocephalus, extra-axial collection or mass lesion/mass effect. Vascular: No hyperdense vessel or unexpected calcification. Skull: Normal. Negative for fracture or focal lesion. Sinuses/Orbits: No acute finding. Other: Soft tissue hematoma with small laceration noted in the left parietal region consistent with the injury. CT CERVICAL SPINE FINDINGS Exam is somewhat degraded by patient motion artifact. Alignment: Within normal limits. Skull base and vertebrae: 7 cervical segments are well visualized. Vertebral body height is well maintained. No acute fracture or acute facet abnormality is seen. Soft tissues and spinal canal: Surrounding soft tissue structures are unremarkable. Upper chest: Negative. Other: None IMPRESSION: CT of the head: Soft tissue hematoma in the left parietal region. No acute intracranial abnormality is noted. CT of cervical spine: No acute abnormality noted. Electronically Signed   By: Alcide Clever M.D.   On: 06/25/2019 12:47    Procedures .Marland KitchenLaceration Repair  Date/Time: 06/25/2019 2:24 PM Performed by: Cathren Laine, MD Authorized by: Cathren Laine, MD   Anesthesia (see MAR for exact dosages):    Anesthesia method:  Local infiltration   Local anesthetic:  Lidocaine 2% WITH epi Laceration details:    Location:  Scalp   Scalp location:  L temporal   Length (cm):  3.5 Repair type:    Repair type:  Intermediate Pre-procedure details:    Preparation:  Patient was prepped and draped in usual sterile fashion Exploration:    Wound exploration: entire depth of wound probed and visualized   Treatment:    Area cleansed with:  Betadine   Amount of cleaning:  Extensive   Irrigation solution:   Sterile saline Subcutaneous repair:    Suture size:  4-0   Suture material:  Vicryl   Suture technique:  Simple interrupted   Number of sutures:  1 Skin repair:    Repair method:  Sutures   Suture size:  4-0   Suture material:  Prolene   Suture technique:  Simple interrupted   Number of sutures:  8 Post-procedure details:    Dressing:  Antibiotic ointment   Patient tolerance of procedure:  Tolerated well, no immediate complications   (including critical care time)  Medications Ordered in ED Medications  lidocaine-EPINEPHrine (XYLOCAINE W/EPI) 2 %-1:200000 (PF) injection 20 mL (has no administration in time range)     Initial Impression / Assessment and Plan / ED Course  I have reviewed the triage vital signs and the nursing notes.  Pertinent labs & imaging results that were available during my care of the patient were reviewed by me and considered in my medical decision making (see chart for details).  Imaging studies ordered.   Reviewed nursing notes and prior charts for additional history.   Wound cleaned.   Wound sutured. No fbs visible in wound. Bacitracin and sterile dressing.   Ancef iv.   Tetanus is up to date.   CT reviewed by me - no ic hem or fx noted.   Recheck pt, spine non tender, aligned. Recheck abd soft nt.   Trial po fluids. Ambulate in hall.  Patient currently appears stable for d/c.   Return precautions provided.      Final Clinical Impressions(s) / ED Diagnoses   Final diagnoses:  None    ED Discharge Orders    None           Cathren LaineSteinl, Lorren Splawn, MD 06/25/19 [phone]

## 2019-06-25 NOTE — Discharge Instructions (Addendum)
It was our pleasure to provide your ER care today - we hope that you feel better.  Keep area of laceration very clean. Take antibiotic as prescribed.   Take acetaminophen or ibuprofen as need.   Have sutures removed, your doctor or urgent care, in 7-10 days.   Return to ER right away if worse, new symptoms, infection of wound (pus from wound, severe pain to area, swelling/redness to area), severe headache, vomiting, new or severe pain, fevers, or other concern.

## 2019-06-25 NOTE — ED Notes (Signed)
MD at bedside suturing wound.

## 2019-07-26 ENCOUNTER — Other Ambulatory Visit: Payer: Self-pay

## 2019-07-26 ENCOUNTER — Encounter (HOSPITAL_COMMUNITY): Payer: Self-pay

## 2019-07-26 ENCOUNTER — Emergency Department (HOSPITAL_COMMUNITY)
Admission: EM | Admit: 2019-07-26 | Discharge: 2019-07-27 | Disposition: A | Discharge status: Home / Self Care | Payer: Medicaid Other | Attending: Emergency Medicine | Admitting: Emergency Medicine

## 2019-07-26 DIAGNOSIS — F17210 Nicotine dependence, cigarettes, uncomplicated: Secondary | ICD-10-CM | POA: Insufficient documentation

## 2019-07-26 DIAGNOSIS — L03114 Cellulitis of left upper limb: Secondary | ICD-10-CM | POA: Insufficient documentation

## 2019-07-26 DIAGNOSIS — L0291 Cutaneous abscess, unspecified: Secondary | ICD-10-CM | POA: Insufficient documentation

## 2019-07-26 DIAGNOSIS — F1110 Opioid abuse, uncomplicated: Secondary | ICD-10-CM | POA: Diagnosis not present

## 2019-07-26 LAB — CBC WITH DIFFERENTIAL/PLATELET
Abs Immature Granulocytes: 0.04 10*3/uL (ref 0.00–0.07)
Basophils Absolute: 0 10*3/uL (ref 0.0–0.1)
Basophils Relative: 0 %
Eosinophils Absolute: 0.4 10*3/uL (ref 0.0–0.5)
Eosinophils Relative: 4 %
HCT: 40.5 % (ref 36.0–46.0)
Hemoglobin: 12.4 g/dL (ref 12.0–15.0)
Immature Granulocytes: 0 %
Lymphocytes Relative: 20 %
Lymphs Abs: 1.9 10*3/uL (ref 0.7–4.0)
MCH: 26.2 pg (ref 26.0–34.0)
MCHC: 30.6 g/dL (ref 30.0–36.0)
MCV: 85.6 fL (ref 80.0–100.0)
Monocytes Absolute: 0.7 10*3/uL (ref 0.1–1.0)
Monocytes Relative: 7 %
Neutro Abs: 6.5 10*3/uL (ref 1.7–7.7)
Neutrophils Relative %: 69 %
Platelets: 283 10*3/uL (ref 150–400)
RBC: 4.73 MIL/uL (ref 3.87–5.11)
RDW: 14.1 % (ref 11.5–15.5)
WBC: 9.5 10*3/uL (ref 4.0–10.5)
nRBC: 0 % (ref 0.0–0.2)

## 2019-07-26 LAB — COMPREHENSIVE METABOLIC PANEL
ALT: 17 U/L (ref 0–44)
AST: 25 U/L (ref 15–41)
Albumin: 3.2 g/dL — ABNORMAL LOW (ref 3.5–5.0)
Alkaline Phosphatase: 100 U/L (ref 38–126)
Anion gap: 10 (ref 5–15)
BUN: 7 mg/dL (ref 6–20)
CO2: 22 mmol/L (ref 22–32)
Calcium: 8.7 mg/dL — ABNORMAL LOW (ref 8.9–10.3)
Chloride: 105 mmol/L (ref 98–111)
Creatinine, Ser: 0.57 mg/dL (ref 0.44–1.00)
GFR calc Af Amer: >60 mL/min (ref >60)
GFR calc non Af Amer: >60 mL/min (ref >60)
Glucose, Bld: 95 mg/dL (ref 70–99)
Potassium: 3.8 mmol/L (ref 3.5–5.1)
Sodium: 137 mmol/L (ref 135–145)
Total Bilirubin: UNDETERMINED mg/dL (ref 0.3–1.2)
Total Protein: 7.2 g/dL (ref 6.5–8.1)

## 2019-07-26 LAB — I-STAT BETA HCG BLOOD, ED (MC, WL, AP ONLY): I-stat hCG, quantitative: <5 m[IU]/mL (ref <5)

## 2019-07-26 LAB — LACTIC ACID, PLASMA: Lactic Acid, Venous: 2.1 mmol/L (ref 0.5–1.9)

## 2019-07-26 MED ORDER — SODIUM CHLORIDE 0.9% FLUSH: 3.0000 mL | Freq: Once | INTRAVENOUS | Status: DC

## 2019-07-26 NOTE — ED Triage Notes (Signed)
Pt reports abscess to left forearm, area is very swollen, reddened, warm to touch. No drainage.

## 2019-07-27 LAB — LACTIC ACID, PLASMA: Lactic Acid, Venous: 0.8 mmol/L (ref 0.5–1.9)

## 2019-07-27 MED ORDER — BACITRACIN ZINC 500 UNIT/GM EX OINT
1.0000 "application " | TOPICAL_OINTMENT | Freq: Once | CUTANEOUS | Status: AC
  Administered 2019-07-27: 1 via TOPICAL

## 2019-07-27 MED ORDER — DOXYCYCLINE HYCLATE 100 MG PO TABS
100.0000 mg | ORAL_TABLET | Freq: Once | ORAL | Status: AC
  Administered 2019-07-27: 100 mg via ORAL
  Filled 2019-07-27: qty 1

## 2019-07-27 MED ORDER — DOXYCYCLINE HYCLATE 100 MG PO TABS: 100.0000 mg | ORAL_TABLET | Freq: Two times a day (BID) | ORAL | 0 refills | Status: AC

## 2019-07-27 MED ORDER — DOXYCYCLINE HYCLATE 100 MG PO TABS: 100.0000 mg | ORAL_TABLET | Freq: Two times a day (BID) | ORAL | 0 refills | Status: DC

## 2019-07-27 NOTE — ED Notes (Signed)
No answer times 2  

## 2019-07-27 NOTE — ED Provider Notes (Addendum)
[facility] EMERGENCY DEPARTMENT Provider Note   CSN: [id number] Arrival date & time: 07/26/19  2033     History   Chief Complaint Chief Complaint  Patient presents with  . Abscess    HPI Victoria Soto is a 33 y.o. female.     HPI Patient has a history of IV drug abuse.  She presents to the ED with complaints of left arm swelling.  Patient states she injected into her left forearm couple of weeks ago in the same area that she is having redness and swelling now.  The vein when she tried to inject and immediately developed an area of swelling.  Patient states over the next week or so she started developing redness and tenderness.  They continue to swell and her skin became tighter and more uncomfortable.  Patient came to the emergency room last evening for evaluation.  Patient applied warm compresses while she was waiting in the ED.  The wound has spontaneously opened and has drained significantly.  Patient states she is feeling better at this time and was getting ready to leave but she was not sure she needed to be on antibiotics.  She denies any fevers or chills. Past Medical History:  Diagnosis Date  . Anxiety   . Depression   . Headache(784.0)   . Heroin abuse affecting pregnancy (HCC)   . Homeless   . IVDU (intravenous drug user)     Patient Active Problem List   Diagnosis Date Noted  . Heroin abuse (HCC) 04/16/2019  . Vaginal delivery 04/16/2019  . IVDU (intravenous drug user)   . Homeless   . Heroin abuse affecting pregnancy (HCC)   . Depression   . Anxiety   . Late prenatal care affecting pregnancy, antepartum 04/15/2019  . Supervision of normal pregnancy 07/04/2017  . Depression with anxiety 07/04/2017  . Smoker 04/25/2015    Past Surgical History:  Procedure Laterality Date  . NO PAST SURGERIES       OB History    Gravida  6   Para  4   Term  4   Preterm  0   AB  2   Living  4     SAB  2   TAB  0   Ectopic  0   Multiple  0   Live Births  4            Home Medications    Prior to Admission medications   Medication Sig Start Date End Date Taking? Authorizing Provider  acetaminophen (TYLENOL) 325 MG tablet Take 2 tablets (650 mg total) by mouth every 4 (four) hours as needed (for pain scale < 4). Patient not taking: Reported on 05/19/2019 04/18/19   Marny Lowenstein, PA-C  cephALEXin (KEFLEX) 500 MG capsule Take 1 capsule (500 mg total) by mouth 4 (four) times daily. 06/25/19   Cathren Laine, MD  doxycycline (VIBRA-TABS) 100 MG tablet Take 1 tablet (100 mg total) by mouth 2 (two) times daily for 14 doses. 07/27/19 08/03/19  Linwood Dibbles, MD  ibuprofen (ADVIL) 600 MG tablet Take 1 tablet (600 mg total) by mouth every 6 (six) hours. Patient not taking: Reported on 05/19/2019 04/18/19   Marny Lowenstein, PA-C  medroxyPROGESTERone (DEPO-PROVERA) 150 MG/ML injection Inject 150 mg into the muscle every 3 (three) months.    [provider]    Family History Family History  Problem Relation Age of Onset  . Muscular dystrophy Other   . Hypertension Mother   .  Diabetes Father   . Cirrhosis Paternal Grandfather   . Anesthesia problems Neg Hx     Social History Social History   Tobacco Use  . Smoking status: Current Every Day Smoker    Packs/day: 1.00    Years: 10.00    Soto years: 10.00    Types: Cigarettes    Last attempt to quit: 02/18/2017    Years since quitting: 2.4  . Smokeless tobacco: Never Used  Substance Use Topics  . Alcohol use: No    Alcohol/week: 0.0 standard drinks  . Drug use: Yes    Types: Heroin, IV    Comment: used heroin yesterday     Allergies   Patient has no known allergies.   Review of Systems Review of Systems  All other systems reviewed and are negative.    Physical Exam Updated Vital Signs BP 113/74 (BP Location: Right Arm)   Pulse 98   Temp 98.3 F (36.8 C) (Oral)   Resp 15   Ht 1.626 m (5\' 4" )   Wt 54.4 kg   SpO2 97%   BMI 20.60 kg/m    Physical Exam Vitals signs and nursing note reviewed.  Constitutional:      General: She is not in acute distress.    Appearance: She is well-developed.  HENT:     Head: Normocephalic and atraumatic.     Right Ear: External ear normal.     Left Ear: External ear normal.  Eyes:     General: No scleral icterus.       Right eye: No discharge.        Left eye: No discharge.     Conjunctiva/sclera: Conjunctivae normal.  Neck:     Musculoskeletal: Neck supple.     Trachea: No tracheal deviation.  Cardiovascular:     Rate and Rhythm: Normal rate.  Pulmonary:     Effort: Pulmonary effort is normal. No respiratory distress.     Breath sounds: No stridor.  Abdominal:     General: There is no distension.  Musculoskeletal:        General: Swelling and tenderness present. No deformity.     Comments: Erythema of the left forearm, proximally 1 cm scab/open wound that is open to the subcutaneous tissue, no fluctuance, some surrounding induration  Skin:    General: Skin is warm and dry.     Findings: No rash.  Neurological:     Mental Status: She is alert.     Cranial Nerves: Cranial nerve deficit: no gross deficits.        ED Treatments / Results  Labs (all labs ordered are listed, but only abnormal results are displayed) Labs Reviewed  LACTIC ACID, PLASMA - Abnormal; Notable for the following components:      Result Value   Lactic Acid, Venous 2.1 (*)    All other components within normal limits  COMPREHENSIVE METABOLIC PANEL - Abnormal; Notable for the following components:   Calcium 8.7 (*)    Albumin 3.2 (*)    All other components within normal limits  CULTURE, BLOOD (ROUTINE X 2)  CULTURE, BLOOD (ROUTINE X 2) W REFLEX TO ID PANEL  LACTIC ACID, PLASMA  CBC WITH DIFFERENTIAL/PLATELET  I-STAT BETA HCG BLOOD, ED ([facility], [facility], [facility] ONLY)    EKG None  Radiology No results found.  Procedures Procedures (including critical care time)  Medications Ordered in ED Medications   sodium chloride flush (NS) 0.9 % injection 3 mL (has no administration in time range)  bacitracin ointment 1 application (has no administration in time range)  doxycycline (VIBRA-TABS) tablet 100 mg (100 mg Oral Given 07/27/19 0920)     Initial Impression / Assessment and Plan / ED Course  I have reviewed the triage vital signs and the nursing notes.  Pertinent labs & imaging results that were available during my care of the patient were reviewed by me and considered in my medical decision making (see chart for details).  Clinical Course as of Jul 26 926  Tue Jul 27, 2019  0924 Labs reviewed.  CBC lactic acid level all unremarkable.   [JK]    Clinical Course User Index [JK] Dorie Rank, MD   Patient's abscess has spontaneously drained.  She has an open wound where I can see the subcutaneous tissue.  No need for I&D at this time.  There is no persistent purulent drainage.  Patient's labs are reassuring.  No evidence to suggest systemic infection.  Wound was irrigated extensively by me.  A sterile dressing was applied.  Plan on discharge home with oral antibiotics.  Discussed continued wound care and warning signs and precautions.  Final Clinical Impressions(s) / ED Diagnoses   Final diagnoses:  Abscess  Cellulitis of left upper extremity    ED Discharge Orders         Ordered    doxycycline (VIBRA-TABS) 100 MG tablet  2 times daily,   Status:  Discontinued     07/27/19 0926    doxycycline (VIBRA-TABS) 100 MG tablet  2 times daily     07/27/19 0927           Dorie Rank, MD 07/27/19 [phone] Corrected dragon dictation error.   Dorie Rank, MD 08/04/19 [phone]

## 2019-07-27 NOTE — Discharge Instructions (Signed)
Continue to keep the wound clean and dry.  Try to wash it daily.  Apply antibiotic ointment.  Take the antibiotics as prescribed.  Return to the ED for fever or other worsening symptoms.

## 2019-07-31 LAB — CULTURE, BLOOD (ROUTINE X 2): Culture: NO GROWTH

## 2019-10-10 IMAGING — CT CT HEAD WITHOUT CONTRAST
4 of 8 series · 17 of 47 positions shown, 19 images · non-contrast
Comparison: None.

CLINICAL DATA: Recent slip and fall with head injury, initial
encounter

EXAM:
CT HEAD WITHOUT CONTRAST
CT CERVICAL SPINE WITHOUT CONTRAST
TECHNIQUE: Multidetector CT imaging of the head and cervical spine was
performed following the standard protocol without intravenous
contrast. Multiplanar CT image reconstructions of the cervical spine
were also generated.

[Series 5: head bone · axial · 0.39mm/px · z∈[-126,-38]mm · 5 of 78 slices shown]
[im 12/78  bone]
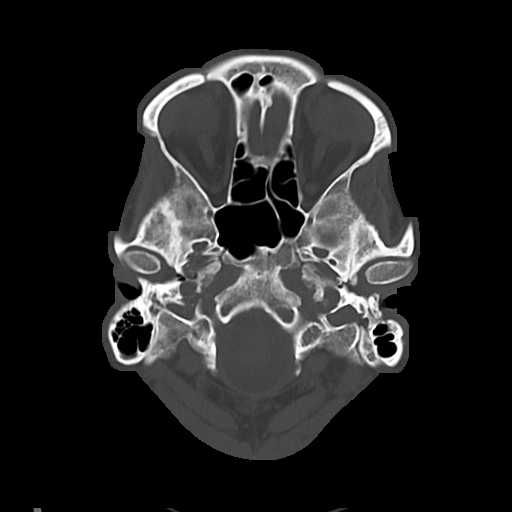
[im 23/78  bone]
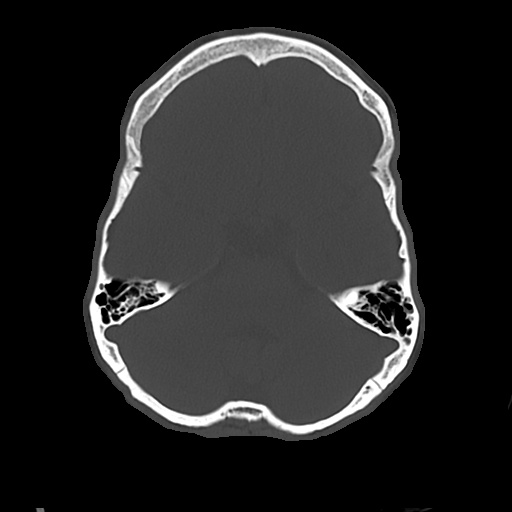
[im 34/78  bone]
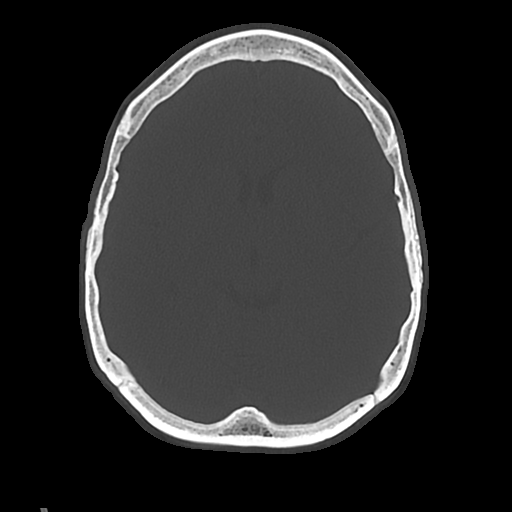
[im 45/78  bone]
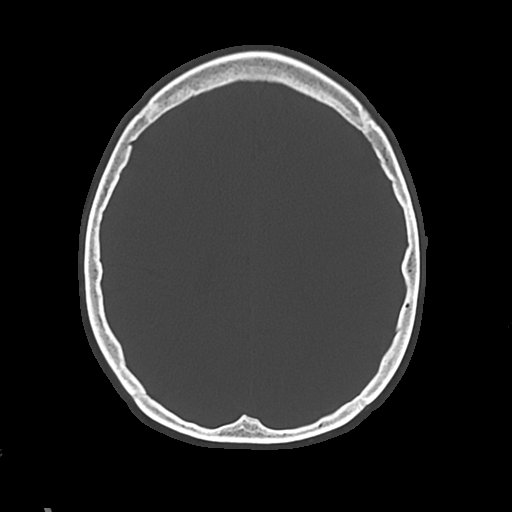
[im 56/78  bone]
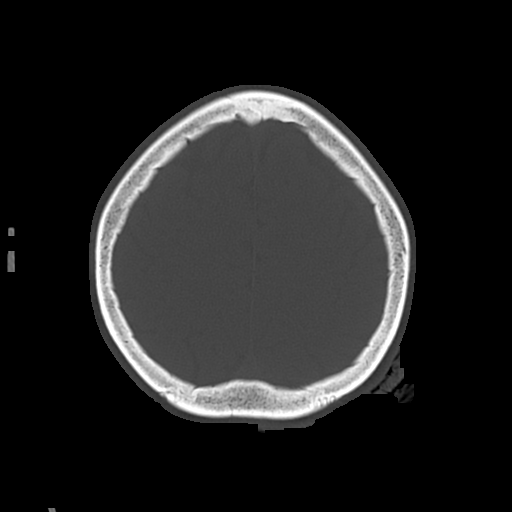

[Series 6: cor soft · coronal · 0.30mm/px · 3 of 63 slices shown]
[im 9/63  brain]
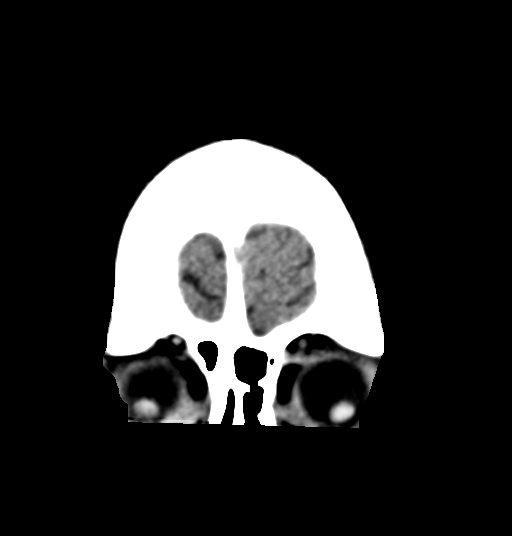
[im 13/63  brain]
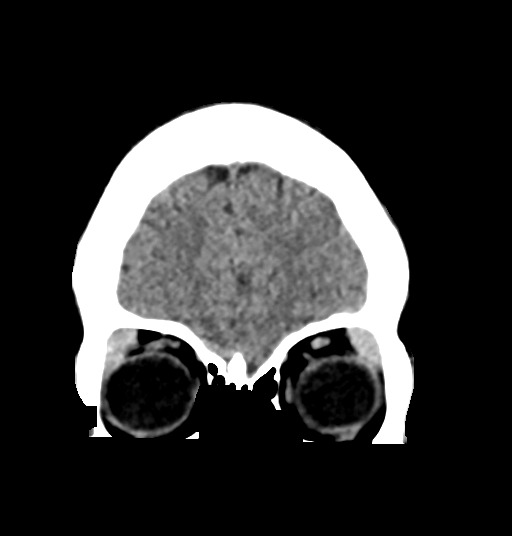
[im 17/63  brain]
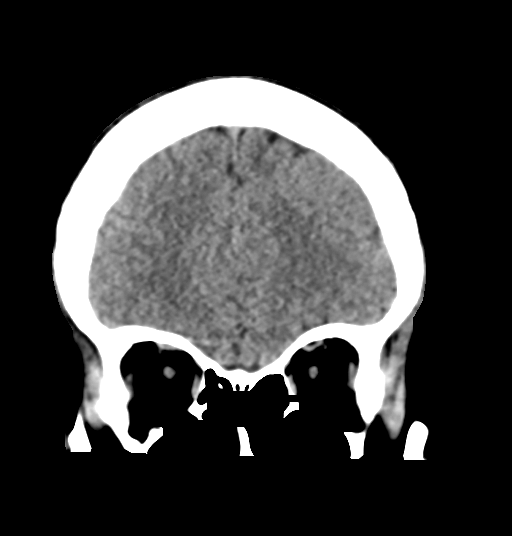

[Series 7: sag soft · sagittal · 0.33mm/px · 1 of 51 slices shown]
[im 26/51  brain]
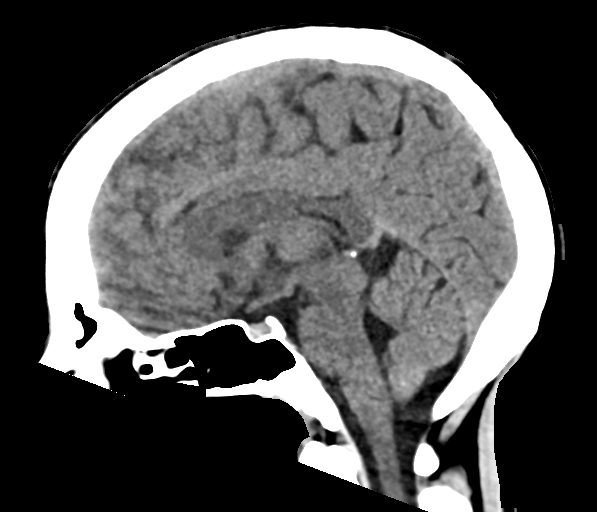

[Series 14: orthogonal axials · axial · 0.21mm/px · z∈[-268,-142]mm · 8 of 90 slices shown, 10 images]
[im 10/90  brain]
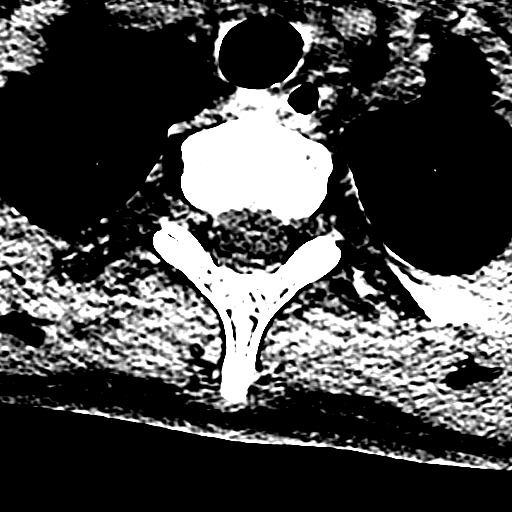
[im 10/90  bone]
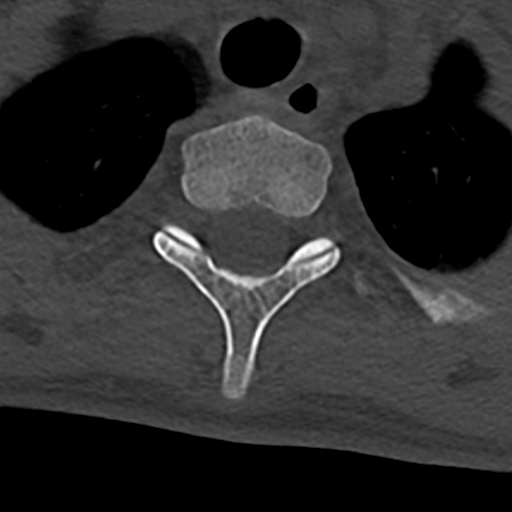
[im 20/90  brain]
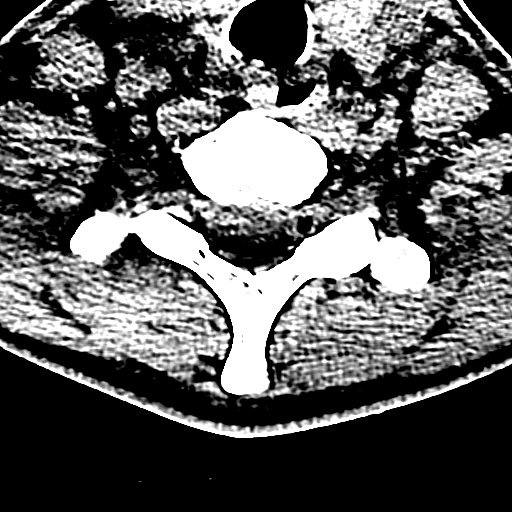
[im 30/90  brain]
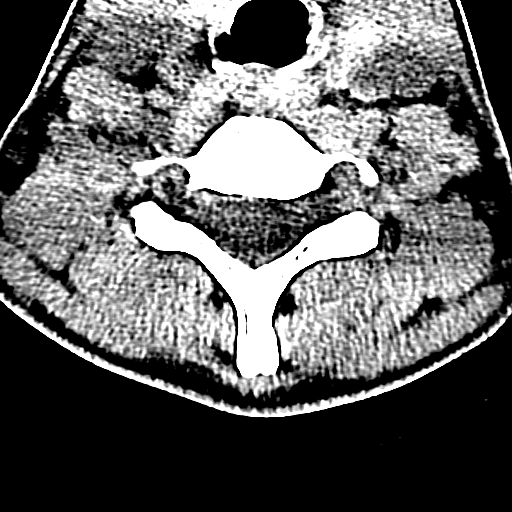
[im 40/90  brain]
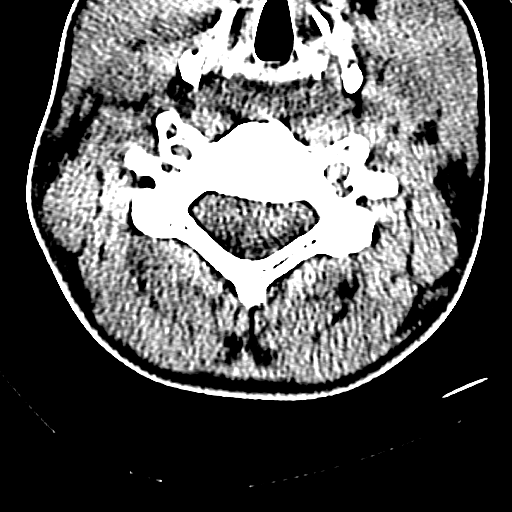
[im 50/90  brain]
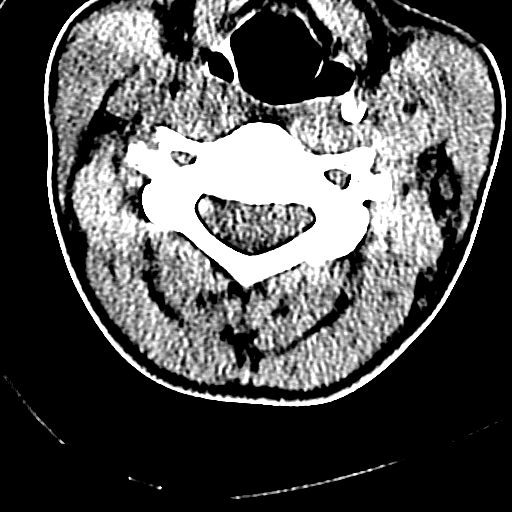
[im 50/90  bone]
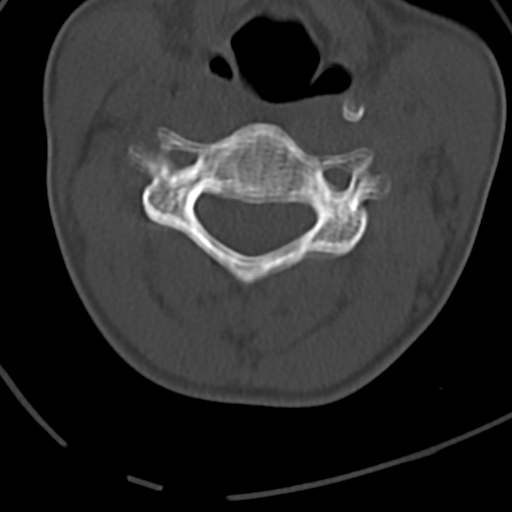
[im 60/90  brain]
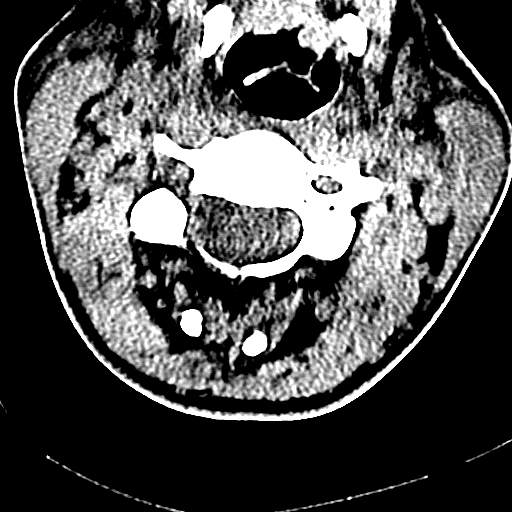
[im 70/90  brain]
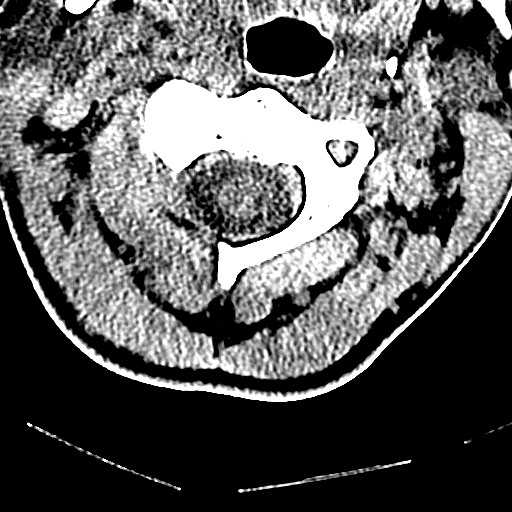
[im 80/90  brain]
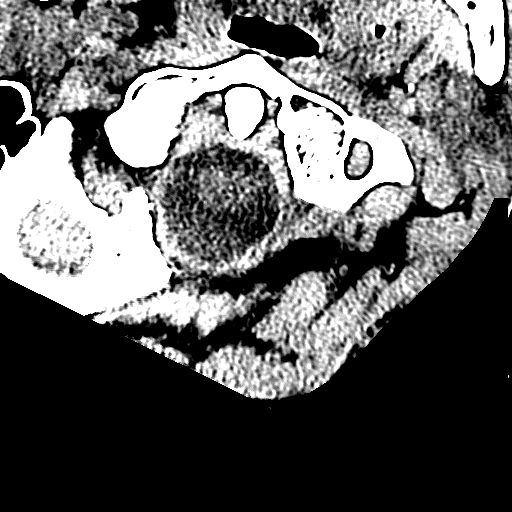

[17 of 47 positions shown; findings below may reference images not displayed]

FINDINGS: CT HEAD FINDINGS

Brain: No evidence of acute infarction, hemorrhage, hydrocephalus,
extra-axial collection or mass lesion/mass effect.

Vascular: No hyperdense vessel or unexpected calcification.

Skull: Normal. Negative for fracture or focal lesion.

Sinuses/Orbits: No acute finding.

Other: Soft tissue hematoma with small laceration noted in the left
parietal region consistent with the injury.

CT CERVICAL SPINE FINDINGS

Exam is somewhat degraded by patient motion artifact.

Alignment: Within normal limits.

Skull base and vertebrae: 7 cervical segments are well visualized.
Vertebral body height is well maintained. No acute fracture or acute
facet abnormality is seen.

Soft tissues and spinal canal: Surrounding soft tissue structures
are unremarkable.

Upper chest: Negative.

Other: None
IMPRESSION: CT of the head: Soft tissue hematoma in the left parietal region.

No acute intracranial abnormality is noted.

CT of cervical spine: No acute abnormality noted.

## 2020-11-30 ENCOUNTER — Other Ambulatory Visit: Payer: Self-pay

## 2020-11-30 ENCOUNTER — Encounter (HOSPITAL_COMMUNITY): Payer: Self-pay | Admitting: *Deleted

## 2020-11-30 ENCOUNTER — Emergency Department (HOSPITAL_COMMUNITY)
Admission: EM | Admit: 2020-11-30 | Discharge: 2020-11-30 | Disposition: A | Discharge status: Home / Self Care | Payer: Medicaid Other | Attending: Emergency Medicine | Admitting: Emergency Medicine

## 2020-11-30 DIAGNOSIS — Z20822 Contact with and (suspected) exposure to covid-19: Secondary | ICD-10-CM | POA: Insufficient documentation

## 2020-11-30 DIAGNOSIS — R059 Cough, unspecified: Secondary | ICD-10-CM | POA: Diagnosis present

## 2020-11-30 DIAGNOSIS — F17210 Nicotine dependence, cigarettes, uncomplicated: Secondary | ICD-10-CM | POA: Insufficient documentation

## 2020-11-30 DIAGNOSIS — J029 Acute pharyngitis, unspecified: Secondary | ICD-10-CM | POA: Insufficient documentation

## 2020-11-30 DIAGNOSIS — R439 Unspecified disturbances of smell and taste: Secondary | ICD-10-CM | POA: Diagnosis not present

## 2020-11-30 MED ORDER — FLUTICASONE PROPIONATE 50 MCG/ACT NA SUSP: 2.0000 | Freq: Every day | NASAL | 2 refills | Status: AC

## 2020-11-30 NOTE — ED Provider Notes (Signed)
[facility] EMERGENCY DEPARTMENT Provider Note   CSN: [id number] Arrival date & time: 11/30/20  1637     History Chief Complaint  Patient presents with  . Covid Exposure    Victoria Soto is a 35 y.o. female.  HPI   This patient is a 35 year old female, she has a history of no significant chronic medical problems other than IV drug use.  She states that she was exposed to somebody with COVID-19, she had a cough and a sore throat last week but that got better and now she is left with only loss of taste and smell.  That started in the last 24 hours.  Symptoms are persistent, mild, no associated fevers or myalgias, no medications prior to arrival  Past Medical History:  Diagnosis Date  . Anxiety   . Depression   . Headache(784.0)   . Heroin abuse affecting pregnancy (HCC)   . Homeless   . IVDU (intravenous drug user)     Patient Active Problem List   Diagnosis Date Noted  . Heroin abuse (HCC) 04/16/2019  . Vaginal delivery 04/16/2019  . IVDU (intravenous drug user)   . Homeless   . Heroin abuse affecting pregnancy (HCC)   . Depression   . Anxiety   . Late prenatal care affecting pregnancy, antepartum 04/15/2019  . Supervision of normal pregnancy 07/04/2017  . Depression with anxiety 07/04/2017  . Smoker 04/25/2015    Past Surgical History:  Procedure Laterality Date  . NO PAST SURGERIES       OB History    Gravida  6   Para  4   Term  4   Preterm  0   AB  2   Living  4     SAB  2   IAB  0   Ectopic  0   Multiple  0   Live Births  4           Family History  Problem Relation Age of Onset  . Muscular dystrophy Other   . Hypertension Mother   . Diabetes Father   . Cirrhosis Paternal Grandfather   . Anesthesia problems Neg Hx     Social History   Tobacco Use  . Smoking status: Current Every Day Smoker    Packs/day: 1.00    Years: 10.00    Pack years: 10.00    Types: Cigarettes    Last attempt to quit: 02/18/2017    Years since  quitting: 3.7  . Smokeless tobacco: Never Used  Substance Use Topics  . Alcohol use: No    Alcohol/week: 0.0 standard drinks  . Drug use: Yes    Types: Heroin, IV    Comment: used heroin yesterday    Home Medications Prior to Admission medications   Medication Sig Start Date End Date Taking? Authorizing Provider  fluticasone (FLONASE) 50 MCG/ACT nasal spray Place 2 sprays into both nostrils daily. 11/30/20  Yes Eber Hong, MD  acetaminophen (TYLENOL) 325 MG tablet Take 2 tablets (650 mg total) by mouth every 4 (four) hours as needed (for pain scale < 4). Patient not taking: Reported on 05/19/2019 04/18/19   Marny Lowenstein, PA-C  cephALEXin (KEFLEX) 500 MG capsule Take 1 capsule (500 mg total) by mouth 4 (four) times daily. 06/25/19   Cathren Laine, MD  ibuprofen (ADVIL) 600 MG tablet Take 1 tablet (600 mg total) by mouth every 6 (six) hours. Patient not taking: Reported on 05/19/2019 04/18/19   Marny Lowenstein, PA-C  medroxyPROGESTERone (  DEPO-PROVERA) 150 MG/ML injection Inject 150 mg into the muscle every 3 (three) months.    [provider]    Allergies    Patient has no known allergies.  Review of Systems   Review of Systems  Constitutional: Negative for fever.  HENT: Positive for sore throat.   Respiratory: Positive for cough. Negative for shortness of breath.   Cardiovascular: Negative for chest pain and leg swelling.  Gastrointestinal: Negative for diarrhea, nausea and vomiting.    Physical Exam Updated Vital Signs BP 105/73 (BP Location: Right Arm)   Pulse 73   Temp 98.2 F (36.8 C) (Oral)   Resp 18   SpO2 100%   Physical Exam Vitals and nursing note reviewed.  Constitutional:      Appearance: She is well-developed and well-nourished. She is not diaphoretic.  HENT:     Head: Normocephalic and atraumatic.     Mouth/Throat:     Comments: Mouth and nose are both clear and patent, there is mild rhinorrhea, no erythema or exudate in the throat Eyes:      General:        Right eye: No discharge.        Left eye: No discharge.     Conjunctiva/sclera: Conjunctivae normal.  Cardiovascular:     Pulses: Normal pulses.     Heart sounds: Normal heart sounds.  Pulmonary:     Effort: Pulmonary effort is normal. No respiratory distress.  Skin:    General: Skin is warm and dry.     Findings: No erythema or rash.  Neurological:     Mental Status: She is alert.     Coordination: Coordination normal.  Psychiatric:        Mood and Affect: Mood and affect normal.     ED Results / Procedures / Treatments   Labs (all labs ordered are listed, but only abnormal results are displayed) Labs Reviewed  SARS CORONAVIRUS 2 (TAT 6-24 HRS)    EKG None  Radiology No results found.  Procedures Procedures   Medications Ordered in ED Medications - No data to display  ED Course  I have reviewed the triage vital signs and the nursing notes.  Pertinent labs & imaging results that were available during my care of the patient were reviewed by me and considered in my medical decision making (see chart for details).    MDM Rules/Calculators/A&P                          Minimal symptoms, mild Covid-like illness, will be tested, stable for discharge, recommended Flonase.  Likely viral URI  Vitals:   11/30/20 1757  BP: 105/73  Pulse: 73  Resp: 18  Temp: 98.2 F (36.8 C)  TempSrc: Oral  SpO2: 100%     Final Clinical Impression(s) / ED Diagnoses Final diagnoses:  Exposure to COVID-19 virus    Rx / DC Orders ED Discharge Orders         Ordered    fluticasone (FLONASE) 50 MCG/ACT nasal spray  Daily        11/30/20 2023           Eber Hong, MD 11/30/20 2025

## 2020-11-30 NOTE — ED Triage Notes (Signed)
Sore throat last week, loss of taste this week

## 2020-11-30 NOTE — Discharge Instructions (Signed)
If your test is positive please quarantine for another 5 days, use Flonase every day in the morning, return to the ER for severe or worsening symptoms, read the attached instructions.

## 2021-08-16 ENCOUNTER — Encounter: Payer: Medicaid Other | Admitting: Obstetrics & Gynecology
